# Patient Record
Sex: Female | Born: 1975 | Race: White | Hispanic: No | Marital: Single | Smoking: Current every day smoker
Health system: Southern US, Community
[De-identification: ages and names within clinical notes are randomized; demographics above are authoritative.]

## PROBLEM LIST (undated history)

## (undated) DIAGNOSIS — G5601 Carpal tunnel syndrome, right upper limb: Secondary | ICD-10-CM

## (undated) DIAGNOSIS — M1990 Unspecified osteoarthritis, unspecified site: Secondary | ICD-10-CM

## (undated) DIAGNOSIS — L659 Nonscarring hair loss, unspecified: Secondary | ICD-10-CM

## (undated) DIAGNOSIS — Z98811 Dental restoration status: Secondary | ICD-10-CM

## (undated) DIAGNOSIS — Z972 Presence of dental prosthetic device (complete) (partial): Secondary | ICD-10-CM

## (undated) DIAGNOSIS — J302 Other seasonal allergic rhinitis: Secondary | ICD-10-CM

---

## 1999-07-26 ENCOUNTER — Emergency Department (HOSPITAL_COMMUNITY): Admission: EM | Admit: 1999-07-26 | Discharge: 1999-07-26 | Payer: Self-pay | Admitting: Emergency Medicine

## 1999-07-26 ENCOUNTER — Encounter: Payer: Self-pay | Admitting: Emergency Medicine

## 2013-03-18 ENCOUNTER — Emergency Department (INDEPENDENT_AMBULATORY_CARE_PROVIDER_SITE_OTHER): Payer: 59

## 2013-03-18 ENCOUNTER — Emergency Department (INDEPENDENT_AMBULATORY_CARE_PROVIDER_SITE_OTHER)
Admission: EM | Admit: 2013-03-18 | Discharge: 2013-03-18 | Disposition: A | Discharge status: Home / Self Care | Payer: 59 | Source: Home / Self Care | Attending: Emergency Medicine | Admitting: Emergency Medicine

## 2013-03-18 ENCOUNTER — Encounter: Payer: Self-pay | Admitting: Emergency Medicine

## 2013-03-18 DIAGNOSIS — M25469 Effusion, unspecified knee: Secondary | ICD-10-CM

## 2013-03-18 DIAGNOSIS — M7650 Patellar tendinitis, unspecified knee: Secondary | ICD-10-CM

## 2013-03-18 DIAGNOSIS — IMO0002 Reserved for concepts with insufficient information to code with codable children: Secondary | ICD-10-CM

## 2013-03-18 DIAGNOSIS — S8391XA Sprain of unspecified site of right knee, initial encounter: Secondary | ICD-10-CM

## 2013-03-18 DIAGNOSIS — M25569 Pain in unspecified knee: Secondary | ICD-10-CM

## 2013-03-18 DIAGNOSIS — M7651 Patellar tendinitis, right knee: Secondary | ICD-10-CM

## 2013-03-18 HISTORY — DX: Nonscarring hair loss, unspecified: L65.9

## 2013-03-18 MED ORDER — IBUPROFEN 600 MG PO TABS: 600.0000 mg | ORAL_TABLET | Freq: Three times a day (TID) | ORAL | Status: DC | PRN

## 2013-03-18 NOTE — ED Provider Notes (Signed)
CSN: [id number]     Arrival date & time 03/18/13  1041 History   First MD Initiated Contact with Patient 03/18/13 1051     Chief Complaint  Patient presents with  . Knee Pain   (Consider location/radiation/quality/duration/timing/severity/associated sxs/prior Treatment) Patient is a 37 y.o. female presenting with knee pain. The history is provided by the patient.  Knee Pain Location:  Knee Time since incident:  1 day Injury: yes   Mechanism of injury comment:  At home, was kneeling while cleaning her house, with repetitive motions of kneeling and squatting Knee location:  R knee Pain details:    Quality:  Sharp   Radiates to:  Does not radiate   Severity:  Moderate   Onset quality:  Gradual   Progression:  Worsening Chronicity:  New Prior injury to area:  No Relieved by:  Rest Worsened by:  Bearing weight and flexion (But she is able to walk and weight-bear) Ineffective treatments:  None tried Associated symptoms: decreased ROM, stiffness and swelling   Associated symptoms: no back pain, no fever, no muscle weakness, no neck pain, no numbness and no tingling   Risk factors: no known bone disorder and no recent illness   Risk factors comment:  No history of arthritis or chronic joint pain   Past Medical History  Diagnosis Date  . Alopecia   . Hydradenitis    Past Surgical History  Procedure Laterality Date  . Cyst excision     Family History  Problem Relation Age of Onset  . Cancer Mother     ovarian, colon CA  . Hyperlipidemia Father   . Heart failure Father    History  Substance Use Topics  . Smoking status: Current Every Day Smoker -- 1.00 packs/day    Types: Cigarettes  . Smokeless tobacco: Never Used  . Alcohol Use: No   OB History   Grav Para Term Preterm Abortions TAB SAB Ect Mult Living                 Review of Systems  Constitutional: Negative for fever.  Respiratory: Negative.   Cardiovascular: Negative.   Genitourinary: Negative.    Musculoskeletal: Positive for stiffness. Negative for back pain and neck pain.  Neurological: Negative.   Psychiatric/Behavioral: Negative.   All other systems reviewed and are negative.    Allergies  Penicillins and Tuberculin tests  Home Medications   Current Outpatient Rx  Name  Route  Sig  Dispense  Refill  . cetirizine (ZYRTEC) 5 MG tablet   Oral   Take 5 mg by mouth daily.         Marland Kitchen ibuprofen (ADVIL,MOTRIN) 600 MG tablet   Oral   Take 1 tablet (600 mg total) by mouth every 8 (eight) hours as needed for pain.   30 tablet   0    BP 144/84  Pulse 103  Resp 16  Ht 5\' 7"  (1.702 m)  Wt 153 lb (69.4 kg)  BMI 23.96 kg/m2  SpO2 98%  LMP 03/18/2013 Physical Exam  Nursing note and vitals reviewed. Constitutional: She is oriented to person, place, and time. She appears well-developed and well-nourished. No distress.  HENT:  Head: Normocephalic and atraumatic.  Eyes: Conjunctivae and EOM are normal. Pupils are equal, round, and reactive to light. No scleral icterus.  Neck: Normal range of motion.  Cardiovascular: Normal rate.   Pulmonary/Chest: Effort normal.  Abdominal: She exhibits no distension.  Musculoskeletal:       Right knee: She exhibits  decreased range of motion, swelling (Mild, diffuse) and bony tenderness. She exhibits no effusion, no ecchymosis, no deformity, no laceration, no LCL laxity, normal patellar mobility and no MCL laxity. Tenderness (Directly over patella, and infrapatellar) found. Patellar tendon (The patellar tendon functions normally, intact) tenderness noted. No medial joint line and no lateral joint line tenderness noted.  Negative Lochman's test.  Neurovascular distally intact  Neurological: She is alert and oriented to person, place, and time.  Skin: Skin is warm.  Psychiatric: She has a normal mood and affect.    ED Course  Procedures (including critical care time) Labs Review Labs Reviewed - No data to display Imaging Review Dg  Knee Complete 4 Views Right  03/18/2013   CLINICAL DATA:  Swelling and pain above patella.  No trauma.  EXAM: RIGHT KNEE - COMPLETE 4+ VIEW  COMPARISON:  None.  FINDINGS: Mild patellofemoral osteoarthritis. No acute fracture or dislocation. No joint effusion.  IMPRESSION: Degenerative change, without acute osseous finding.   Electronically Signed   By: Jeronimo Greaves M.D.   On: 03/18/2013 12:07    EKG Interpretation     Ventricular Rate:    PR Interval:    QRS Duration:   QT Interval:    QTC Calculation:   R Axis:     Text Interpretation:              MDM   1. Right knee sprain, initial encounter   2. Patellar tendinitis, right    Reviewed x-ray with patient. No acute bony abnormalities. No fracture or dislocation. No joint effusion. There is mild osteoarthritis. Discussed diagnosis and treatment options. Prescription for ibuprofen 600 mg every 8 hours with food when necessary pain. She declined any other pain med. Elastic knee sleeve. Knee sprain instruction sheet given. Questions invited and answered. Followup with orthopedist if not improving in one week, sooner if worse or new symptoms.--Names and phone numbers of orthopedists given. Precautions discussed. Red flags discussed. Questions invited and answered. Patient voiced understanding and agreement.    Lajean Manes, MD 03/18/13 [phone]

## 2013-03-18 NOTE — ED Notes (Signed)
Jennifer Robinson c/o right knee pain x this AM. She did a lot of house work yesterday and cleaning while on her knees. No previous injury.

## 2013-03-29 ENCOUNTER — Ambulatory Visit (INDEPENDENT_AMBULATORY_CARE_PROVIDER_SITE_OTHER): Payer: 59 | Admitting: Sports Medicine

## 2013-03-29 ENCOUNTER — Encounter: Payer: Self-pay | Admitting: Sports Medicine

## 2013-03-29 ENCOUNTER — Other Ambulatory Visit: Payer: Self-pay | Admitting: *Deleted

## 2013-03-29 VITALS — BP 135/86 | HR 92 | Wt 161.0 lb

## 2013-03-29 DIAGNOSIS — G5603 Carpal tunnel syndrome, bilateral upper limbs: Secondary | ICD-10-CM | POA: Insufficient documentation

## 2013-03-29 DIAGNOSIS — M222X1 Patellofemoral disorders, right knee: Secondary | ICD-10-CM | POA: Insufficient documentation

## 2013-03-29 DIAGNOSIS — G5600 Carpal tunnel syndrome, unspecified upper limb: Secondary | ICD-10-CM

## 2013-03-29 DIAGNOSIS — M25569 Pain in unspecified knee: Secondary | ICD-10-CM

## 2013-03-29 MED ORDER — MELOXICAM 15 MG PO TABS: ORAL_TABLET | ORAL | Status: DC

## 2013-03-29 NOTE — Assessment & Plan Note (Signed)
Improved significantly with conservative measures. Mobic. Home exercise. Return as needed for this.

## 2013-03-29 NOTE — Progress Notes (Signed)
   Subjective:    I'm seeing this patient as a consultation for:  Dr. 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 Unfortunately for the past few months she's had pain that is sharp and tingling that radiates from her wrist into her fingers. It's worse after sleeping, is worse when trying to grasp items. Moderate, persistent. No pain in her neck, no pain in the forearm or upper arm.  Right knee pain: Had an injury where she sprained it, the pain is essentially resolved with the exception of some soreness under the kneecap when going up and down stairs. She also gets a slight grinding sensation.  Past medical history, Surgical history, Family history not pertinant except as noted below, Social history, Allergies, and medications have been entered into the medical record, reviewed, and no changes needed.   Review of Systems: No headache, visual changes, nausea, vomiting, diarrhea, constipation, dizziness, abdominal pain, skin rash, fevers, chills, night sweats, weight loss, swollen lymph nodes, body aches, joint swelling, muscle aches, chest pain, shortness of breath, mood changes, visual or auditory hallucinations.   Objective:   General: Well Developed, well nourished, and in no acute distress.  Neuro/Psych: Alert and oriented x3, extra-ocular muscles intact, able to move all 4 extremities, sensation grossly intact. Skin: Warm and dry, no rashes noted.  Respiratory: Not using accessory muscles, speaking in full sentences, trachea midline.  Cardiovascular: Pulses palpable, no extremity edema. Abdomen: Does not appear distended. Bilateral Wrist: Inspection normal with no visible erythema or swelling. ROM smooth and normal with good flexion and extension and ulnar/radial deviation that is symmetrical with opposite wrist. Palpation is normal over metacarpals, navicular, lunate, and TFCC;  tendons without tenderness/ swelling No snuffbox tenderness. No tenderness over Canal of [place]. Strength 5/5 in all directions without pain. Negative Finkelstein, positive Tinel's test, negative Phalen's test. Negative Watson's test. Right Knee: Normal to inspection with no erythema or effusion or obvious bony abnormalities. Palpation normal with no warmth, joint line tenderness, patellar tenderness, or condyle tenderness. ROM full in flexion and extension and lower leg rotation. Ligaments with solid consistent endpoints including ACL, PCL, LCL, MCL. Negative Mcmurray's, Apley's, and Thessalonian tests. Patellar glide with crepitus and mild pain on compression. Patellar and quadriceps tendons unremarkable. Hamstring and quadriceps strength is normal.   X-rays show patellofemoral arthritis.  Impression and Recommendations:   This case required medical decision making of moderate complexity.

## 2013-03-29 NOTE — Assessment & Plan Note (Signed)
Bilateral nighttime splinting. Home exercises. Mobic. Return in 4 weeks, we can consider an injection if no better.

## 2013-04-12 ENCOUNTER — Encounter: Payer: Self-pay | Admitting: Emergency Medicine

## 2013-04-12 ENCOUNTER — Emergency Department
Admission: EM | Admit: 2013-04-12 | Discharge: 2013-04-12 | Disposition: A | Discharge status: Home / Self Care | Payer: 59 | Source: Home / Self Care | Attending: Emergency Medicine | Admitting: Emergency Medicine

## 2013-04-12 DIAGNOSIS — H60399 Other infective otitis externa, unspecified ear: Secondary | ICD-10-CM

## 2013-04-12 DIAGNOSIS — H6093 Unspecified otitis externa, bilateral: Secondary | ICD-10-CM

## 2013-04-12 NOTE — ED Notes (Signed)
Jennifer Robinson reports her employer wants her to wear earplugs but she c/o ear plugs giving her ear infections and a hx of chronic ear infections. She is here for a note allowing her to wear ear muffs instead of ear plug while @ work .

## 2013-04-14 NOTE — ED Provider Notes (Signed)
CSN: [id number]     Arrival date & time 04/12/13  1456 History   First MD Initiated Contact with Patient 04/12/13 1456     Chief Complaint  Patient presents with  . Ear Problem   (Consider location/radiation/quality/duration/timing/severity/associated sxs/prior Treatment) HPI Jennifer Robinson reports her employer wants her to wear earplugs but she c/o ear plugs giving her ear infections and a hx of chronic ear infections. She is here for a note allowing her to wear ear muffs instead of ear plug while @ work .  Currently, complains of mild dull bilateral external ear irritation from wearing earplugs the past week at work. No current discharge or fever or hearing problems. No other ENT symptoms, except minimal sinus allergy symptoms, controlled on Zyrtec 5 mg daily Past Medical History  Diagnosis Date  . Alopecia   . Hydradenitis    Past Surgical History  Procedure Laterality Date  . Cyst excision     Family History  Problem Relation Age of Onset  . Cancer Mother     ovarian, colon CA  . Hyperlipidemia Father   . Heart failure Father    History  Substance Use Topics  . Smoking status: Current Every Day Smoker -- 1.00 packs/day    Types: Cigarettes  . Smokeless tobacco: Never Used  . Alcohol Use: No   OB History   Grav Para Term Preterm Abortions TAB SAB Ect Mult Living                 Review of Systems  All other systems reviewed and are negative.    Allergies  Penicillins and Tuberculin tests  Home Medications   Current Outpatient Rx  Name  Route  Sig  Dispense  Refill  . cetirizine (ZYRTEC) 5 MG tablet   Oral   Take 5 mg by mouth daily.         . meloxicam (MOBIC) 15 MG tablet      One tab PO qAM with breakfast for 2 weeks, then daily prn pain.   30 tablet   3    BP 138/84  Pulse 74  Temp(Src) 98.2 F (36.8 C) (Oral)  Resp 14  SpO2 100%  LMP 03/10/2013 Physical Exam  Nursing note and vitals reviewed. Constitutional: She is oriented to person, place,  and time. She appears well-developed and well-nourished. No distress.  HENT:  Head: Normocephalic and atraumatic.  Right Ear: Tympanic membrane normal.  Left Ear: Tympanic membrane normal.  Nose: Nose normal.  Mouth/Throat: Oropharynx is clear and moist. No oral lesions.  + minimal Tenderness over RIGHT external ear. No Swelling RIGHT external ear canal. No  Discharge/debris in RIGHT external ear canal.  + minimal Tenderness over LEFT external ear. No swelling LEFT external ear canal. No Discharge/debris in LEFT external ear canal.   Eyes: Conjunctivae are normal.  Neck: Neck supple.  Lymphadenopathy:    She has no cervical adenopathy.  Neurological: She is alert and oriented to person, place, and time.  Skin: Skin is warm and dry.    ED Course  Procedures (including critical care time) Labs Review Labs Reviewed - No data to display Imaging Review No results found.  EKG Interpretation    Date/Time:    Ventricular Rate:    PR Interval:    QRS Duration:   QT Interval:    QTC Calculation:   R Axis:     Text Interpretation:              MDM   1.  Otitis externa of both ears    Likely, this is a mechanical irritation/otitis externa of both ears, secondary to frequently wearing earplugs. We discussed this at length.-- At her request, I wrote a note for work digitalizing to avoid earplugs, and instead, to wear over the ear earmuffs-type for your protection while at work. At this time, no sign of infection, so no antibiotic prescribed. Other advice given. Explained to her that if she had any other your symptoms, to follow-up with her ENT. She voiced understanding and agreement.    Lajean Manes, MD 04/14/13 1323

## 2013-04-26 ENCOUNTER — Ambulatory Visit (INDEPENDENT_AMBULATORY_CARE_PROVIDER_SITE_OTHER): Payer: 59 | Admitting: Sports Medicine

## 2013-04-26 ENCOUNTER — Encounter: Payer: Self-pay | Admitting: Sports Medicine

## 2013-04-26 VITALS — BP 135/84 | HR 91 | Wt 160.0 lb

## 2013-04-26 DIAGNOSIS — G5603 Carpal tunnel syndrome, bilateral upper limbs: Secondary | ICD-10-CM

## 2013-04-26 DIAGNOSIS — M222X1 Patellofemoral disorders, right knee: Secondary | ICD-10-CM

## 2013-04-26 DIAGNOSIS — G5600 Carpal tunnel syndrome, unspecified upper limb: Secondary | ICD-10-CM

## 2013-04-26 DIAGNOSIS — M25569 Pain in unspecified knee: Secondary | ICD-10-CM

## 2013-04-26 NOTE — Assessment & Plan Note (Signed)
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 Return in one month, we can consider a bilateral median nerve hydrodissection if no better.

## 2013-04-26 NOTE — Assessment & Plan Note (Signed)
Resolved with conservative measures. 

## 2013-04-26 NOTE — Progress Notes (Signed)
  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  Bilateral carpal tunnel syndrome: Improved significantly with nighttime splinting, home exercises, anti-inflammatories, desires to continue night splinting before consideration of injection.  Past medical history, Surgical history, Family history not pertinant except as noted below, Social history, Allergies, and medications have been entered into the medical record, reviewed, and no changes needed.   Review of Systems: No fevers, chills, night sweats, weight loss, chest pain, or shortness of breath.   Objective:    General: Well Developed, well nourished, and in no acute distress.  Neuro: Alert and oriented x3, extra-ocular muscles intact, sensation grossly intact.  HEENT: Normocephalic, atraumatic, pupils equal round reactive to light, neck supple, no masses, no lymphadenopathy, thyroid nonpalpable.  Skin: Warm and dry, no rashes. Cardiac: Regular rate and rhythm, no murmurs rubs or gallops, no lower extremity edema.  Respiratory: Clear to auscultation bilaterally. Not using accessory muscles, speaking in full sentences.  Impression and Recommendations:

## 2013-06-07 ENCOUNTER — Ambulatory Visit (INDEPENDENT_AMBULATORY_CARE_PROVIDER_SITE_OTHER): Payer: 59 | Admitting: Sports Medicine

## 2013-06-07 ENCOUNTER — Encounter: Payer: Self-pay | Admitting: Sports Medicine

## 2013-06-07 VITALS — BP 130/81 | HR 96 | Wt 156.0 lb

## 2013-06-07 DIAGNOSIS — G5603 Carpal tunnel syndrome, bilateral upper limbs: Secondary | ICD-10-CM

## 2013-06-07 DIAGNOSIS — G5600 Carpal tunnel syndrome, unspecified upper limb: Secondary | ICD-10-CM

## 2013-06-07 NOTE — Assessment & Plan Note (Signed)
Bilateral median nerve hydrodissection as above. Continue nighttime splinting. Return in one month.

## 2013-06-07 NOTE — Progress Notes (Signed)
  Subjective:    CC: Followup  HPI: This pleasant 38 year old female returns after several months of conservative treatment of bilateral carpal tunnel syndrome. We have been through night splinting, NSAIDs, home stretches and rehabilitation exercises. Nothing is helping. She returns today with symptoms moderate, persistent, and desiring interventional treatment.  Past medical history, Surgical history, Family history not pertinant except as noted below, Social history, Allergies, and medications have been entered into the medical record, reviewed, and no changes needed.   Review of Systems: No fevers, chills, night sweats, weight loss, chest pain, or shortness of breath.   Objective:    General: Well Developed, well nourished, and in no acute distress.  Neuro: Alert and oriented x3, extra-ocular muscles intact, sensation grossly intact.  HEENT: Normocephalic, atraumatic, pupils equal round reactive to light, neck supple, no masses, no lymphadenopathy, thyroid nonpalpable.  Skin: Warm and dry, no rashes. Cardiac: Regular rate and rhythm, no murmurs rubs or gallops, no lower extremity edema.  Respiratory: Clear to auscultation bilaterally. Not using accessory muscles, speaking in full sentences.  Procedure: Real-time Ultrasound Guided hydrodissection/Injection of left carpal tunnel/median nerve Device: GE Logiq E  Verbal informed consent obtained.  Time-out conducted.  Noted no overlying erythema, induration, or other signs of local infection.  Skin prepped in a sterile fashion.  Local anesthesia: Topical Ethyl chloride.  With sterile technique and under real time ultrasound guidance:  A total of 1 cc Kenalog 40, 4 cc lidocaine injected superficial tube and deep to the median nerve dissecting it from surrounding structures. Completed without difficulty  Pain immediately resolved suggesting accurate placement of the medication.  Advised to call if fevers/chills, erythema, induration,  drainage, or persistent bleeding.  Images permanently stored and available for review in the ultrasound unit.  Impression: Technically successful ultrasound guided injection.  Procedure: Real-time Ultrasound Guided hydrodissection/Injection of right carpal tunnel/median nerve Device: GE Logiq E  Verbal informed consent obtained.  Time-out conducted.  Noted no overlying erythema, induration, or other signs of local infection.  Skin prepped in a sterile fashion.  Local anesthesia: Topical Ethyl chloride.  With sterile technique and under real time ultrasound guidance:  A total of 1 cc Kenalog 40, 4 cc lidocaine injected superficial tube and deep to the median nerve dissecting it from surrounding structures. Completed without difficulty  Pain immediately resolved suggesting accurate placement of the medication.  Advised to call if fevers/chills, erythema, induration, drainage, or persistent bleeding.  Images permanently stored and available for review in the ultrasound unit.  Impression: Technically successful ultrasound guided injection.  Impression and Recommendations:

## 2013-07-05 ENCOUNTER — Ambulatory Visit (INDEPENDENT_AMBULATORY_CARE_PROVIDER_SITE_OTHER): Payer: 59 | Admitting: Sports Medicine

## 2013-07-05 ENCOUNTER — Encounter: Payer: Self-pay | Admitting: Sports Medicine

## 2013-07-05 VITALS — BP 130/80 | HR 97 | Ht 67.0 in | Wt 150.0 lb

## 2013-07-05 DIAGNOSIS — G5600 Carpal tunnel syndrome, unspecified upper limb: Secondary | ICD-10-CM

## 2013-07-05 DIAGNOSIS — L732 Hidradenitis suppurativa: Secondary | ICD-10-CM

## 2013-07-05 DIAGNOSIS — M25569 Pain in unspecified knee: Secondary | ICD-10-CM

## 2013-07-05 DIAGNOSIS — M222X1 Patellofemoral disorders, right knee: Secondary | ICD-10-CM

## 2013-07-05 DIAGNOSIS — G5603 Carpal tunnel syndrome, bilateral upper limbs: Secondary | ICD-10-CM

## 2013-07-05 MED ORDER — MINOCYCLINE HCL 100 MG PO CAPS: 100.0000 mg | ORAL_CAPSULE | Freq: Two times a day (BID) | ORAL | Status: DC

## 2013-07-05 MED ORDER — CLINDAMYCIN PHOSPHATE 1 % EX GEL: Freq: Two times a day (BID) | CUTANEOUS | Status: DC

## 2013-07-05 MED ORDER — MELOXICAM 15 MG PO TABS: ORAL_TABLET | ORAL | Status: DC

## 2013-07-05 NOTE — Progress Notes (Signed)
  Subjective:    CC: Followup  HPI: Bilateral carpal tunnel syndrome: Status post bilateral median nerve hydrodissection, she reports that she is approximately 80+ percent at her. Happy with the results so far.  Hidradenitis suppurativa: Typically gets flares over the buttock, her dermatologist is typically prescribed minocycline 100 mg twice a day for 3 months, and topical clindamycin. She asks if I can do this as well. Symptoms are moderate, persistent.  Past medical history, Surgical history, Family history not pertinant except as noted below, Social history, Allergies, and medications have been entered into the medical record, reviewed, and no changes needed.   Review of Systems: No fevers, chills, night sweats, weight loss, chest pain, or shortness of breath.   Objective:    General: Well Developed, well nourished, and in no acute distress.  Neuro: Alert and oriented x3, extra-ocular muscles intact, sensation grossly intact.  HEENT: Normocephalic, atraumatic, pupils equal round reactive to light, neck supple, no masses, no lymphadenopathy, thyroid nonpalpable.  Skin: Warm and dry, no rashes. There is an abscess appearing structure that is nonfluctuant over the right buttock, there is warmth, erythema, but no induration or drainage. Cardiac: Regular rate and rhythm, no murmurs rubs or gallops, no lower extremity edema.  Respiratory: Clear to auscultation bilaterally. Not using accessory muscles, speaking in full sentences.  Impression and Recommendations:

## 2013-07-05 NOTE — Assessment & Plan Note (Signed)
80% improvement bilaterally. Continue nighttime splinting. Return as needed for this.

## 2013-07-05 NOTE — Assessment & Plan Note (Signed)
Minocycline 100 mg twice a day for 3 months. Topical clindamycin. Return as needed.

## 2013-10-04 ENCOUNTER — Ambulatory Visit: Payer: 59 | Admitting: Sports Medicine

## 2014-01-13 ENCOUNTER — Ambulatory Visit (INDEPENDENT_AMBULATORY_CARE_PROVIDER_SITE_OTHER): Payer: 59 | Admitting: Sports Medicine

## 2014-01-13 ENCOUNTER — Encounter: Payer: Self-pay | Admitting: Sports Medicine

## 2014-01-13 VITALS — BP 145/87 | HR 103 | Ht 67.0 in | Wt 165.0 lb

## 2014-01-13 DIAGNOSIS — G5603 Carpal tunnel syndrome, bilateral upper limbs: Secondary | ICD-10-CM

## 2014-01-13 DIAGNOSIS — G5600 Carpal tunnel syndrome, unspecified upper limb: Secondary | ICD-10-CM

## 2014-01-13 NOTE — Assessment & Plan Note (Signed)
Persistent symptoms with a o two-month response to ultrasound-guided bilateral median nerve hydrodissection, has already failed nighttime splinting and therapy. At this point we are going to refer her to go for orthopedics for consideration of carpal tunnel release.

## 2014-01-13 NOTE — Progress Notes (Signed)
  Subjective:    CC: Followup  HPI: Bilateral carpal tunnel syndrome: Good response to a previous bilateral median nerve hydrodissection, unfortunately she had a recurrence of pain approximately 2 months after the injection. She is amenable to considering surgical intervention.  Past medical history, Surgical history, Family history not pertinant except as noted below, Social history, Allergies, and medications have been entered into the medical record, reviewed, and no changes needed.   Review of Systems: No fevers, chills, night sweats, weight loss, chest pain, or shortness of breath.   Objective:    General: Well Developed, well nourished, and in no acute distress.  Neuro: Alert and oriented x3, extra-ocular muscles intact, sensation grossly intact.  HEENT: Normocephalic, atraumatic, pupils equal round reactive to light, neck supple, no masses, no lymphadenopathy, thyroid nonpalpable.  Skin: Warm and dry, no rashes. Cardiac: Regular rate and rhythm, no murmurs rubs or gallops, no lower extremity edema.  Respiratory: Clear to auscultation bilaterally. Not using accessory muscles, speaking in full sentences.  Impression and Recommendations:

## 2014-03-03 ENCOUNTER — Other Ambulatory Visit: Payer: Self-pay | Admitting: Orthopedic Surgery

## 2014-03-04 ENCOUNTER — Encounter (HOSPITAL_BASED_OUTPATIENT_CLINIC_OR_DEPARTMENT_OTHER): Payer: Self-pay | Admitting: *Deleted

## 2014-03-06 NOTE — H&P (Signed)
Jennifer Robinson is an 38 y.o. female.   CC / Reason for Visit: Bilateral hand pain with numbness and tingling HPI: This patient returns for reevaluation having undergone an interval electrodiagnostic studies with Dr. Regino SchultzeWang revealing mild bilateral carpal tunnel syndrome with Martin-Gruber anastomosis.  She reports she is symptomatically the same and wishes to proceed surgically.  She would like to proceed first on the left side.    Presenting history follows: This patient is a 38 year old female who presents for evaluation of numbness and tingling involving both hands.  With careful questioning, she has had symptoms for a long time, recently worse.  She is not diabetic, and had tried meloxicam but stopped as it seems not to be helping.  She has had wrist splints to wear at night time and has had ultrasound guided injections performed already which were transiently helpful.  Past Medical History  Diagnosis Date  . Alopecia   . Arthritis     knee  . Carpal tunnel syndrome of left wrist 02/2014  . Hidradenitis 03/04/2014    buttocks  . Dental crown present   . Wears partial dentures   . Seasonal allergies     Past Surgical History  Procedure Laterality Date  . No past surgeries      Family History  Problem Relation Age of Onset  . Cancer Mother     ovarian, colon CA  . Hyperlipidemia Father   . Heart failure Father    Social History:  reports that she has been smoking Cigarettes.  She has a 22 pack-year smoking history. She has never used smokeless tobacco. She reports that she drinks alcohol. She reports that she does not use illicit drugs.  Allergies:  Allergies  Allergen Reactions  . Latex Rash  . Penicillins Rash  . Ppd [Tuberculin Purified Protein Derivative] Rash    No prescriptions prior to admission    No results found for this or any previous visit (from the past 48 hour(s)). No results found.  Review of Systems  All other systems reviewed and are  negative.   Height 5\' 7"  (1.702 m), weight 70.308 kg (155 lb), last menstrual period 02/18/2014. Physical Exam  Constitutional:  WD, WN, NAD HEENT:  NCAT, EOMI Neuro/Psych:  Alert & oriented to person, place, and time; appropriate mood & affect Lymphatic: No generalized UE edema or lymphadenopathy Extremities / MSK:  Both UE are normal with respect to appearance, ranges of motion, joint stability, muscle strength/tone, sensation, & perfusion except as otherwise noted:  Both hands have good musculature.  Monofilament testing 2.83 across all the digits.  Bilaterally with Phalen's maneuver she has symptoms that radiate into the long and ring fingers, with a Tinel sign over both median nerves just proximal to the wrist crease.  Labs / X-rays: No radiographic studies obtained today.  Assessment:  Confirmed bilateral carpal tunnel syndrome  Plan:  We reviewed the lateral studies and the details of endoscopic carpal tunnel release.  She would like to proceed first on the left side.  We will plan to proceed at a time that fits her schedule best.    The details of the operative procedure were discussed with the patient.  Questions were invited and answered.  In addition to the goal of the procedure, the risks of the procedure to include but not limited to bleeding; infection; damage to the nerves or blood vessels that could result in bleeding, numbness, weakness, chronic pain, and the need for additional procedures; stiffness; the need for  revision surgery; and anesthetic risks, the worst of which is death, were reviewed.  No specific outcome was guaranteed or implied.  Informed consent was obtained.    Javien Tesch A. 03/06/2014, 2:15 PM

## 2014-03-07 ENCOUNTER — Other Ambulatory Visit: Payer: Self-pay

## 2014-03-10 ENCOUNTER — Encounter (HOSPITAL_BASED_OUTPATIENT_CLINIC_OR_DEPARTMENT_OTHER)
Admission: RE | Disposition: A | Discharge status: Home / Self Care | Payer: Self-pay | Source: Ambulatory Visit | Attending: Orthopedic Surgery

## 2014-03-10 ENCOUNTER — Ambulatory Visit (HOSPITAL_BASED_OUTPATIENT_CLINIC_OR_DEPARTMENT_OTHER): Payer: 59 | Admitting: Certified Registered"

## 2014-03-10 ENCOUNTER — Encounter (HOSPITAL_BASED_OUTPATIENT_CLINIC_OR_DEPARTMENT_OTHER): Payer: 59 | Admitting: Certified Registered"

## 2014-03-10 ENCOUNTER — Ambulatory Visit (HOSPITAL_BASED_OUTPATIENT_CLINIC_OR_DEPARTMENT_OTHER)
Admission: RE | Admit: 2014-03-10 | Discharge: 2014-03-10 | Disposition: A | Discharge status: Home / Self Care | Payer: 59 | Source: Ambulatory Visit | Attending: Orthopedic Surgery | Admitting: Orthopedic Surgery

## 2014-03-10 ENCOUNTER — Encounter (HOSPITAL_BASED_OUTPATIENT_CLINIC_OR_DEPARTMENT_OTHER): Payer: Self-pay | Admitting: Certified Registered"

## 2014-03-10 DIAGNOSIS — F17210 Nicotine dependence, cigarettes, uncomplicated: Secondary | ICD-10-CM | POA: Diagnosis not present

## 2014-03-10 DIAGNOSIS — G5602 Carpal tunnel syndrome, left upper limb: Secondary | ICD-10-CM | POA: Diagnosis not present

## 2014-03-10 DIAGNOSIS — G5600 Carpal tunnel syndrome, unspecified upper limb: Secondary | ICD-10-CM | POA: Diagnosis present

## 2014-03-10 HISTORY — DX: Unspecified osteoarthritis, unspecified site: M19.90

## 2014-03-10 HISTORY — DX: Presence of dental prosthetic device (complete) (partial): Z97.2

## 2014-03-10 HISTORY — DX: Other seasonal allergic rhinitis: J30.2

## 2014-03-10 HISTORY — DX: Dental restoration status: Z98.811

## 2014-03-10 HISTORY — PX: CARPAL TUNNEL RELEASE: SHX101

## 2014-03-10 LAB — POCT HEMOGLOBIN-HEMACUE: HEMOGLOBIN: 15.1 g/dL — AB (ref 12.0–15.0)

## 2014-03-10 SURGERY — RELEASE, CARPAL TUNNEL, ENDOSCOPIC
Anesthesia: Monitor Anesthesia Care | Site: Wrist | Laterality: Left

## 2014-03-10 MED ORDER — MIDAZOLAM HCL 5 MG/5ML IJ SOLN
INTRAMUSCULAR | Status: DC | PRN
  Administered 2014-03-10: 2 mg via INTRAVENOUS

## 2014-03-10 MED ORDER — LIDOCAINE HCL (CARDIAC) 20 MG/ML IV SOLN
INTRAVENOUS | Status: DC | PRN
  Administered 2014-03-10: 60 mg via INTRAVENOUS

## 2014-03-10 MED ORDER — LACTATED RINGERS IV SOLN
INTRAVENOUS | Status: DC | PRN
  Administered 2014-03-10: 10:00:00 via INTRAVENOUS

## 2014-03-10 MED ORDER — LIDOCAINE HCL 2 % IJ SOLN
INTRAMUSCULAR | Status: DC | PRN
  Administered 2014-03-10: 3 mL

## 2014-03-10 MED ORDER — OXYCODONE-ACETAMINOPHEN 5-325 MG PO TABS: 1.0000 | ORAL_TABLET | ORAL | Status: DC | PRN

## 2014-03-10 MED ORDER — FENTANYL CITRATE 0.05 MG/ML IJ SOLN
INTRAMUSCULAR | Status: DC | PRN
  Administered 2014-03-10: 100 ug via INTRAVENOUS

## 2014-03-10 MED ORDER — DEXAMETHASONE SODIUM PHOSPHATE 10 MG/ML IJ SOLN
INTRAMUSCULAR | Status: DC | PRN
  Administered 2014-03-10: 4 mg via INTRAVENOUS

## 2014-03-10 MED ORDER — FENTANYL CITRATE 0.05 MG/ML IJ SOLN
INTRAMUSCULAR | Status: AC
  Filled 2014-03-10: qty 2

## 2014-03-10 MED ORDER — PROPOFOL INFUSION 10 MG/ML OPTIME
INTRAVENOUS | Status: DC | PRN
  Administered 2014-03-10: 100 ug/kg/min via INTRAVENOUS

## 2014-03-10 MED ORDER — OXYCODONE HCL 5 MG PO TABS: 5.0000 mg | ORAL_TABLET | Freq: Once | ORAL | Status: DC | PRN

## 2014-03-10 MED ORDER — LIDOCAINE HCL 2 % IJ SOLN
INTRAMUSCULAR | Status: AC
  Filled 2014-03-10: qty 20

## 2014-03-10 MED ORDER — BUPIVACAINE-EPINEPHRINE 0.5% -1:200000 IJ SOLN
INTRAMUSCULAR | Status: DC | PRN
  Administered 2014-03-10: 3 mL

## 2014-03-10 MED ORDER — MIDAZOLAM HCL 2 MG/2ML IJ SOLN
INTRAMUSCULAR | Status: AC
  Filled 2014-03-10: qty 2

## 2014-03-10 MED ORDER — OXYCODONE HCL 5 MG/5ML PO SOLN: 5.0000 mg | Freq: Once | ORAL | Status: DC | PRN

## 2014-03-10 MED ORDER — HYDROMORPHONE HCL 1 MG/ML IJ SOLN: 0.2500 mg | INTRAMUSCULAR | Status: DC | PRN

## 2014-03-10 MED ORDER — CEFAZOLIN SODIUM-DEXTROSE 2-3 GM-% IV SOLR
INTRAVENOUS | Status: AC
Start: 2014-03-10 — End: 2014-03-10
  Filled 2014-03-10: qty 50

## 2014-03-10 SURGICAL SUPPLY — 44 items
APPLICATOR COTTON TIP 6IN STRL (MISCELLANEOUS) ×3 IMPLANT
BLADE HOOK ENDO STRL (BLADE) ×3 IMPLANT
BLADE SURG 15 STRL LF DISP TIS (BLADE) ×1 IMPLANT
BLADE SURG 15 STRL SS (BLADE) ×3
BLADE TRIANGLE EPF/EGR ENDO (BLADE) ×3 IMPLANT
BNDG CMPR 9X4 STRL LF SNTH (GAUZE/BANDAGES/DRESSINGS) ×1
BNDG COHESIVE 4X5 TAN STRL (GAUZE/BANDAGES/DRESSINGS) ×3 IMPLANT
BNDG ESMARK 4X9 LF (GAUZE/BANDAGES/DRESSINGS) ×3 IMPLANT
BNDG GAUZE ELAST 4 BULKY (GAUZE/BANDAGES/DRESSINGS) ×3 IMPLANT
CHLORAPREP W/TINT 26ML (MISCELLANEOUS) ×3 IMPLANT
CORDS BIPOLAR (ELECTRODE) IMPLANT
COVER BACK TABLE 60X90IN (DRAPES) ×3 IMPLANT
COVER MAYO STAND STRL (DRAPES) ×3 IMPLANT
CUFF TOURNIQUET SINGLE 18IN (TOURNIQUET CUFF) ×2 IMPLANT
DRAIN TLS ROUND 10FR (DRAIN) ×3 IMPLANT
DRAPE EXTREMITY TIBURON (DRAPES) ×3 IMPLANT
DRAPE SURG 17X23 STRL (DRAPES) ×3 IMPLANT
DRSG EMULSION OIL 3X3 NADH (GAUZE/BANDAGES/DRESSINGS) ×3 IMPLANT
GAUZE SPONGE 4X4 12PLY STRL (GAUZE/BANDAGES/DRESSINGS) ×3 IMPLANT
GLOVE BIO SURGEON STRL SZ7.5 (GLOVE) ×1 IMPLANT
GLOVE BIOGEL PI IND STRL 7.0 (GLOVE) ×1 IMPLANT
GLOVE BIOGEL PI IND STRL 8 (GLOVE) ×1 IMPLANT
GLOVE BIOGEL PI INDICATOR 7.0 (GLOVE)
GLOVE BIOGEL PI INDICATOR 8 (GLOVE)
GLOVE ECLIPSE 6.5 STRL STRAW (GLOVE) ×1 IMPLANT
GLOVE SURG SS PI 6.0 STRL IVOR (GLOVE) ×4 IMPLANT
GLOVE SURG SS PI 7.5 STRL IVOR (GLOVE) ×2 IMPLANT
GOWN STRL REUS W/ TWL LRG LVL3 (GOWN DISPOSABLE) ×1 IMPLANT
GOWN STRL REUS W/TWL LRG LVL3 (GOWN DISPOSABLE) ×6
GOWN STRL REUS W/TWL XL LVL3 (GOWN DISPOSABLE) ×3 IMPLANT
NDL HYPO 25X1 1.5 SAFETY (NEEDLE) IMPLANT
NEEDLE HYPO 25X1 1.5 SAFETY (NEEDLE) ×3 IMPLANT
NS IRRIG 1000ML POUR BTL (IV SOLUTION) ×3 IMPLANT
PACK BASIN DAY SURGERY FS (CUSTOM PROCEDURE TRAY) ×3 IMPLANT
PADDING CAST ABS 4INX4YD NS (CAST SUPPLIES)
PADDING CAST ABS COTTON 4X4 ST (CAST SUPPLIES) IMPLANT
STOCKINETTE 6  STRL (DRAPES) ×2
STOCKINETTE 6 STRL (DRAPES) ×1 IMPLANT
SUT VICRYL RAPIDE 4-0 (SUTURE) ×3 IMPLANT
SYR BULB 3OZ (MISCELLANEOUS) ×3 IMPLANT
SYRINGE 10CC LL (SYRINGE) IMPLANT
TOWEL OR 17X24 6PK STRL BLUE (TOWEL DISPOSABLE) ×6 IMPLANT
TOWEL OR NON WOVEN STRL DISP B (DISPOSABLE) ×3 IMPLANT
UNDERPAD 30X30 INCONTINENT (UNDERPADS AND DIAPERS) ×3 IMPLANT

## 2014-03-10 NOTE — Transfer of Care (Signed)
Immediate Anesthesia Transfer of Care Note  Patient: [name]  Procedure(s) Performed: Procedure(s): CARPAL TUNNEL RELEASE ENDOSCOPIC LEFT  (Left)  Patient Location: PACU  Anesthesia Type:MAC  Level of Consciousness: awake, alert , oriented and patient cooperative  Airway & Oxygen Therapy: Patient Spontanous Breathing and Patient connected to face mask oxygen  Post-op Assessment: Report given to PACU RN and Post -op Vital signs reviewed and stable  Post vital signs: Reviewed and stable  Complications: No apparent anesthesia complications

## 2014-03-10 NOTE — Anesthesia Procedure Notes (Signed)
Procedure Name: MAC Date/Time: 03/10/2014 9:45 AM Performed by: Cristoval Teall Pre-anesthesia Checklist: Patient identified, Timeout performed, Emergency Drugs available, Suction available and Patient being monitored Patient Re-evaluated:Patient Re-evaluated prior to inductionOxygen Delivery Method: Simple face mask

## 2014-03-10 NOTE — Anesthesia Preprocedure Evaluation (Addendum)
Anesthesia Evaluation  Patient identified by MRN, date of birth, ID band Patient awake    Reviewed: Allergy & Precautions, H&P , NPO status , Patient's Chart, lab work & pertinent test results  Airway Mallampati: II TM Distance: >3 FB Neck ROM: Full    Dental no notable dental hx. (+) Partial Upper, Dental Advisory Given   Pulmonary Current Smoker,  breath sounds clear to auscultation  Pulmonary exam normal       Cardiovascular negative cardio ROS  Rhythm:Regular Rate:Normal     Neuro/Psych negative neurological ROS  negative psych ROS   GI/Hepatic negative GI ROS, Neg liver ROS,   Endo/Other  negative endocrine ROS  Renal/GU negative Renal ROS  negative genitourinary   Musculoskeletal   Abdominal   Peds  Hematology negative hematology ROS (+)   Anesthesia Other Findings   Reproductive/Obstetrics negative OB ROS                          Anesthesia Physical Anesthesia Plan  ASA: II  Anesthesia Plan: MAC   Post-op Pain Management:    Induction: Intravenous  Airway Management Planned: Simple Face Mask  Additional Equipment:   Intra-op Plan:   Post-operative Plan:   Informed Consent: I have reviewed the patients History and Physical, chart, labs and discussed the procedure including the risks, benefits and alternatives for the proposed anesthesia with the patient or authorized representative who has indicated his/her understanding and acceptance.   Dental advisory given  Plan Discussed with: CRNA  Anesthesia Plan Comments:         Anesthesia Quick Evaluation

## 2014-03-10 NOTE — Discharge Instructions (Signed)
Discharge Instructions ° ° °You have a light dressing on your hand.  °You may begin gentle motion of your fingers and hand immediately, but you should not do any heavy lifting or gripping.  Elevate your hand to reduce pain & swelling of the digits.  Ice over the operative site may be helpful to reduce pain & swelling.  DO NOT USE HEAT. °Pain medicine has been prescribed for you.  °Use your medicine as needed over the first 48 hours, and then you can begin to taper your use. You may use Tylenol in place of your prescribed pain medication, but not IN ADDITION to it. °Leave the dressing in place until the third day after your surgery and then remove it, leaving it open to air.  °After the bandage has been removed you may shower, but do not soak the incision.  °You may drive a car when you are off of prescription pain medications and can safely control your vehicle with both hands. °We will address whether therapy will be required or not when you return to the office. °You may have already made your follow-up appointment when we completed your preop visit.  If not, please call our office today or the next business day to make your return appointment for 10-15 days after surgery. ° ° °Please call [phone] during normal business hours or [phone] after hours for any problems. Including the following: ° °- excessive redness of the incisions °- drainage for more than 4 days °- fever of more than 101.5 F ° °*Please note that pain medications will not be refilled after hours or on weekends. ° ° °TLS Drain Instructions °You have a drain tube in place to help limit the amount of blood that collects under your skin in the early post-operative period.  The amount it drains will vary from person-to-person and is dependent upon many factors.  You will have 1-2 extra drain tubes sent with you from the facility.  You should change the tube according to these instructions below when the tube is about half full. ° °BE SURE TO  SLIDE THE CLAMP TO THE CLOSED POSITION BEFORE CHANGING THE TUBE.   ° °RE-OPEN THE CLAMP ONCE THE GLASS EVACUATION TUBE HAS BEEN CHANGED. ° °24 hours after surgery the amount of drainage will likely be negligible and it will be time to remove the tube and discard it.  Simply remove the tape that secures the flexible plastic drainage tube to the bandage and briskly pull the tube straight out.  You will likely feel a little discomfort during this process, but the tube should slide out as it is not sewn or otherwise secured directly to your body.  It is merely held in place by the bandage itself.  °                           ° °If you are not clear about when your first post-op appointment is scheduled, please call the office on the next business day to inquire about it.  Please also call the office if you have any other questions ([phone]).   ° °Post Anesthesia Home Care Instructions ° °Activity: °Get plenty of rest for the remainder of the day. A responsible adult should stay with you for 24 hours following the procedure.  °For the next 24 hours, DO NOT: °-Drive a car °-Operate machinery °-Drink alcoholic beverages °-Take any medication unless instructed by your physician °-Make any legal decisions or   sign important papers. ° °Meals: °Start with liquid foods such as gelatin or soup. Progress to regular foods as tolerated. Avoid greasy, spicy, heavy foods. If nausea and/or vomiting occur, drink only clear liquids until the nausea and/or vomiting subsides. Call your physician if vomiting continues. ° °Special Instructions/Symptoms: °Your throat may feel dry or sore from the anesthesia or the breathing tube placed in your throat during surgery. If this causes discomfort, gargle with warm salt water. The discomfort should disappear within 24 hours. ° °

## 2014-03-10 NOTE — Interval H&P Note (Signed)
History and Physical Interval Note:  03/10/2014 9:28 AM  Jennifer Robinson  has presented today for surgery, with the diagnosis of Carpal Tunnel Syndrome  The various methods of treatment have been discussed with the patient and family. After consideration of risks, benefits and other options for treatment, the patient has consented to  Procedure(s): CARPAL TUNNEL RELEASE ENDOSCOPIC LEFT  (Left) as a surgical intervention .  The patient's history has been reviewed, patient examined, no change in status, stable for surgery.  I have reviewed the patient's chart and labs.  Questions were answered to the patient's satisfaction.     Juergen Hardenbrook A.

## 2014-03-10 NOTE — Op Note (Signed)
03/10/2014  9:26 AM  PATIENT:  Jennifer Robinson  38 y.o. female  PRE-OPERATIVE DIAGNOSIS:  Carpal Tunnel Syndrome  POST-OPERATIVE DIAGNOSIS:  Same  PROCEDURE:  Procedure(s): CARPAL TUNNEL RELEASE ENDOSCOPIC LEFT   SURGEON:  Surgeon(s): Jodi Marbleavid A Jevon Littlepage, MD  PHYSICIAN ASSISTANT: Danielle RankinKirsten Schrader, OPA-C  ANESTHESIA:  Local / MAC  SPECIMENS:  None  DRAINS:   None  PREOPERATIVE INDICATIONS:  Jennifer Robinson is a  38 y.o. female with a diagnosis of Carpal Tunnel Syndrome who failed conservative measures and elected for surgical management.    The risks benefits and alternatives were discussed with the patient preoperatively including but not limited to the risks of infection, bleeding, nerve injury, cardiopulmonary complications, the need for revision surgery, among others, and the patient verbalized understanding and consented to proceed.  OPERATIVE IMPLANTS: None  OPERATIVE FINDINGS: Tight carpal tunnel, acceptably decompressed following release  OPERATIVE PROCEDURE:  After receiving prophylactic antibiotics, the patient was escorted to the operative theatre and placed in a supine position.   A surgical "time-out" was performed during which the planned procedure, proposed operative site, and the correct patient identity were compared to the operative consent and agreement confirmed by the circulating nurse according to current facility policy.  Following application of a tourniquet to the operative extremity, the planned incisions were marked and anesthetized with a mixture of lidocaine & bupivicaine containing epinephrine.  The exposed skin was prepped with Chloraprep and draped in the usual sterile fashion.  The limb was exsanguinated with an Esmarch bandage and the tourniquet inflated to approximately 100mmHg higher than systolic BP.   The incisions were made sharply. Subcutaneous tissues were dissected with blunt and spreading dissection. At the proximal incision, the deep forearm  fascia was split in line with the skin incision the distal edge grasped with a hemostat. At the mid palmar incision, the palmar fascia was split in line with the skin incision, revealing the underlying superficial palmar arch which was visualized with loupe assisted magnification. The synovial reflector was then introduced into the proximal incision and passed through the carpal canal, uses to reflect synovium from the deep surface of the transverse carpal ligament. It was removed and replaced with the slotted cannula and blunt obturator, passing from proximal to distal and exiting the distal wound superficial to the superficial palmar arch. The obturator was removed and the camera inserted. Visualization of the ligament was acceptable. The triangle shape blade was then inserted distally, advanced to the midportion of the ligament, and there used to create a perforation in the ligament. This instrument was removed and the hooked nstrument was inserted. It was placed into the perforation in the ligament and withdrawn distally, completing transection of the distal half of the ligament. The camera was then removed and placed into the distal end of the cannula. The hooked instrument was placed into the proximal end and advanced facility to be placed into the apex of the V. which had been formed to the distal hemi-transection of the ligament. It was withdrawn proximally, completing transection of the ligament. The adequacy of the release was judged with the scope and the instruments used as a probe. All of the endoscopic instruments are removed and the adequacy of release was again judged from the proximal incision perspective with direct loupe assisted visualization. In addition, the proximal forearm fascia was split for 2 inches proximal to the proximal incision under direct visualization using a sliding scissor technique. The tourniquet was released, the wound copiously irrigated. A TLS drain was  placed to lie  alongside the nerve exiting its own skin hole distally made with some sharp trocar. The skin was then closed with 4-0 Vicryl Rapide interrupted sutures. A light dressing was applied and the balance of 10 mL of the initial anesthetic mixture was placed down the drain, and the drain was clamped to be unclamped in the recovery room.  DISPOSITION:  The patient will be discharged home today, returning in 10-15 days for re-assessment.

## 2014-03-10 NOTE — Anesthesia Postprocedure Evaluation (Signed)
  Anesthesia Post-op Note  Patient: [name]  Procedure(s) Performed: Procedure(s): CARPAL TUNNEL RELEASE ENDOSCOPIC LEFT  (Left)  Patient Location: PACU  Anesthesia Type: MAC  Level of Consciousness: awake and alert   Airway and Oxygen Therapy: Patient Spontanous Breathing  Post-op Pain: none  Post-op Assessment: Post-op Vital signs reviewed, Patient's Cardiovascular Status Stable and Respiratory Function Stable  Post-op Vital Signs: Reviewed  Filed Vitals:   03/10/14 1045  BP: 120/79  Pulse: 79  Temp:   Resp: 17    Complications: No apparent anesthesia complications

## 2014-03-11 ENCOUNTER — Encounter (HOSPITAL_BASED_OUTPATIENT_CLINIC_OR_DEPARTMENT_OTHER): Payer: Self-pay | Admitting: Orthopedic Surgery

## 2014-03-23 DIAGNOSIS — G5601 Carpal tunnel syndrome, right upper limb: Secondary | ICD-10-CM

## 2014-03-23 HISTORY — DX: Carpal tunnel syndrome, right upper limb: G56.01

## 2014-03-27 ENCOUNTER — Other Ambulatory Visit: Payer: Self-pay | Admitting: Orthopedic Surgery

## 2014-03-27 ENCOUNTER — Encounter (HOSPITAL_BASED_OUTPATIENT_CLINIC_OR_DEPARTMENT_OTHER): Payer: Self-pay | Admitting: *Deleted

## 2014-03-29 NOTE — H&P (Signed)
Jennifer Robinson is an 38 y.o. female.   CC / Reason for Visit: Bilateral hand pain with numbness and tingling HPI: This patient returns for reevaluation having undergone an interval electrodiagnostic studies with Dr. Regino SchultzeWang revealing mild bilateral carpal tunnel syndrome with Martin-Gruber anastomosis.  She has already undergone a L ECTR and wishes to proceed on the right.  Presenting history follows: This patient is a 38 year old female who presents for evaluation of numbness and tingling involving both hands.  With careful questioning, she has had symptoms for a long time, recently worse.  She is not diabetic, and had tried meloxicam but stopped as it seems not to be helping.  She has had wrist splints to wear at night time and has had ultrasound guided injections performed already which were transiently helpful.  Past Medical History  Diagnosis Date  . Alopecia   . Dental crown present   . Wears partial dentures     upper  . Seasonal allergies   . Arthritis     both knees  . Carpal tunnel syndrome of right wrist 03/2014    Past Surgical History  Procedure Laterality Date  . Carpal tunnel release Left 03/10/2014    Procedure: CARPAL TUNNEL RELEASE ENDOSCOPIC LEFT ;  Surgeon: Jodi Marbleavid A Tyeisha Dinan, MD;  Location: [facility];  Service: Orthopedics;  Laterality: Left;    Family History  Problem Relation Age of Onset  . Cancer Mother     ovarian, colon CA  . Hyperlipidemia Father   . Heart failure Father    Social History:  reports that she has been smoking Cigarettes.  She has a 22 pack-year smoking history. She has never used smokeless tobacco. She reports that she drinks alcohol. She reports that she does not use illicit drugs.  Allergies:  Allergies  Allergen Reactions  . Latex Rash  . Penicillins Rash  . Ppd [Tuberculin Purified Protein Derivative] Rash    No prescriptions prior to admission    No results found for this or any previous visit (from the past 48  hour(s)). No results found.  Review of Systems  All other systems reviewed and are negative.   Height 5\' 7"  (1.702 m), weight 70.308 kg (155 lb), last menstrual period 03/13/2014. Physical Exam  Constitutional:  WD, WN, NAD HEENT:  NCAT, EOMI Neuro/Psych:  Alert & oriented to person, place, and time; appropriate mood & affect Lymphatic: No generalized UE edema or lymphadenopathy Extremities / MSK:  Both UE are normal with respect to appearance, ranges of motion, joint stability, muscle strength/tone, sensation, & perfusion except as otherwise noted:  Right:  Monofilament testing 2.83 across all the digits.  With Phalen's maneuver she has symptoms that radiate into the long and ring fingers, with a Tinel sign over both median nerves just proximal to the wrist crease.  Labs / X-rays: No radiographic studies obtained today.  Assessment:  Right carpal tunnel syndrome  Plan:  We reviewed the lateral studies and the details of endoscopic carpal tunnel release.  We will proceed with R ECTR.  The details of the operative procedure were discussed with the patient.  Questions were invited and answered.  In addition to the goal of the procedure, the risks of the procedure to include but not limited to bleeding; infection; damage to the nerves or blood vessels that could result in bleeding, numbness, weakness, chronic pain, and the need for additional procedures; stiffness; the need for revision surgery; and anesthetic risks, the worst of which is death, were  reviewed.  No specific outcome was guaranteed or implied.  Informed consent was obtained.    Malicia Blasdel A. 03/29/2014, 4:41 PM

## 2014-03-31 ENCOUNTER — Encounter (HOSPITAL_BASED_OUTPATIENT_CLINIC_OR_DEPARTMENT_OTHER): Payer: Self-pay

## 2014-03-31 ENCOUNTER — Ambulatory Visit (HOSPITAL_BASED_OUTPATIENT_CLINIC_OR_DEPARTMENT_OTHER): Payer: 59 | Admitting: Certified Registered"

## 2014-03-31 ENCOUNTER — Encounter (HOSPITAL_BASED_OUTPATIENT_CLINIC_OR_DEPARTMENT_OTHER)
Admission: RE | Disposition: A | Discharge status: Home / Self Care | Payer: Self-pay | Source: Ambulatory Visit | Attending: Orthopedic Surgery

## 2014-03-31 ENCOUNTER — Ambulatory Visit (HOSPITAL_BASED_OUTPATIENT_CLINIC_OR_DEPARTMENT_OTHER)
Admission: RE | Admit: 2014-03-31 | Discharge: 2014-03-31 | Disposition: A | Discharge status: Home / Self Care | Payer: 59 | Source: Ambulatory Visit | Attending: Orthopedic Surgery | Admitting: Orthopedic Surgery

## 2014-03-31 DIAGNOSIS — Z887 Allergy status to serum and vaccine status: Secondary | ICD-10-CM | POA: Insufficient documentation

## 2014-03-31 DIAGNOSIS — M13861 Other specified arthritis, right knee: Secondary | ICD-10-CM | POA: Diagnosis not present

## 2014-03-31 DIAGNOSIS — M13862 Other specified arthritis, left knee: Secondary | ICD-10-CM | POA: Diagnosis not present

## 2014-03-31 DIAGNOSIS — L659 Nonscarring hair loss, unspecified: Secondary | ICD-10-CM | POA: Diagnosis not present

## 2014-03-31 DIAGNOSIS — G5601 Carpal tunnel syndrome, right upper limb: Secondary | ICD-10-CM | POA: Insufficient documentation

## 2014-03-31 DIAGNOSIS — Z880 Allergy status to penicillin: Secondary | ICD-10-CM | POA: Insufficient documentation

## 2014-03-31 DIAGNOSIS — Z91040 Latex allergy status: Secondary | ICD-10-CM | POA: Diagnosis not present

## 2014-03-31 DIAGNOSIS — F17210 Nicotine dependence, cigarettes, uncomplicated: Secondary | ICD-10-CM | POA: Insufficient documentation

## 2014-03-31 HISTORY — DX: Carpal tunnel syndrome, right upper limb: G56.01

## 2014-03-31 HISTORY — PX: CARPAL TUNNEL RELEASE: SHX101

## 2014-03-31 LAB — POCT HEMOGLOBIN-HEMACUE: Hemoglobin: 15.1 g/dL — ABNORMAL HIGH (ref 12.0–15.0)

## 2014-03-31 SURGERY — RELEASE, CARPAL TUNNEL, ENDOSCOPIC
Anesthesia: Monitor Anesthesia Care | Site: Wrist | Laterality: Right

## 2014-03-31 MED ORDER — MIDAZOLAM HCL 5 MG/5ML IJ SOLN
INTRAMUSCULAR | Status: DC | PRN
Start: 2014-03-31 — End: 2014-03-31
  Administered 2014-03-31: 2 mg via INTRAVENOUS

## 2014-03-31 MED ORDER — FENTANYL CITRATE 0.05 MG/ML IJ SOLN: 25.0000 ug | INTRAMUSCULAR | Status: DC | PRN

## 2014-03-31 MED ORDER — MEPERIDINE HCL 25 MG/ML IJ SOLN: 6.2500 mg | INTRAMUSCULAR | Status: DC | PRN

## 2014-03-31 MED ORDER — FENTANYL CITRATE 0.05 MG/ML IJ SOLN
INTRAMUSCULAR | Status: AC
  Filled 2014-03-31: qty 2

## 2014-03-31 MED ORDER — MIDAZOLAM HCL 2 MG/2ML IJ SOLN
INTRAMUSCULAR | Status: AC
  Filled 2014-03-31: qty 2

## 2014-03-31 MED ORDER — OXYCODONE HCL 5 MG PO TABS: 5.0000 mg | ORAL_TABLET | Freq: Once | ORAL | Status: DC | PRN

## 2014-03-31 MED ORDER — OXYCODONE HCL 5 MG/5ML PO SOLN: 5.0000 mg | Freq: Once | ORAL | Status: DC | PRN

## 2014-03-31 MED ORDER — ONDANSETRON HCL 4 MG/2ML IJ SOLN
INTRAMUSCULAR | Status: DC | PRN
  Administered 2014-03-31: 4 mg via INTRAVENOUS

## 2014-03-31 MED ORDER — LIDOCAINE HCL 2 % IJ SOLN
INTRAMUSCULAR | Status: DC | PRN
  Administered 2014-03-31: 10 mL

## 2014-03-31 MED ORDER — PROMETHAZINE HCL 25 MG/ML IJ SOLN: 6.2500 mg | INTRAMUSCULAR | Status: DC | PRN

## 2014-03-31 MED ORDER — FENTANYL CITRATE 0.05 MG/ML IJ SOLN: 50.0000 ug | INTRAMUSCULAR | Status: DC | PRN

## 2014-03-31 MED ORDER — OXYCODONE-ACETAMINOPHEN 5-325 MG PO TABS: 1.0000 | ORAL_TABLET | Freq: Four times a day (QID) | ORAL | Status: DC | PRN

## 2014-03-31 MED ORDER — MIDAZOLAM HCL 2 MG/2ML IJ SOLN: 1.0000 mg | INTRAMUSCULAR | Status: DC | PRN

## 2014-03-31 MED ORDER — VANCOMYCIN HCL IN DEXTROSE 1-5 GM/200ML-% IV SOLN
1000.0000 mg | INTRAVENOUS | Status: AC
  Administered 2014-03-31: 1000 mg via INTRAVENOUS

## 2014-03-31 MED ORDER — VANCOMYCIN HCL IN DEXTROSE 1-5 GM/200ML-% IV SOLN
INTRAVENOUS | Status: AC
  Filled 2014-03-31: qty 200

## 2014-03-31 MED ORDER — HYDROMORPHONE HCL 1 MG/ML IJ SOLN
INTRAMUSCULAR | Status: AC
  Filled 2014-03-31: qty 1

## 2014-03-31 MED ORDER — HYDROMORPHONE HCL 1 MG/ML IJ SOLN
0.2500 mg | INTRAMUSCULAR | Status: DC | PRN
  Administered 2014-03-31: 0.5 mg via INTRAVENOUS

## 2014-03-31 MED ORDER — LACTATED RINGERS IV SOLN: INTRAVENOUS | Status: DC

## 2014-03-31 MED ORDER — BUPIVACAINE-EPINEPHRINE 0.5% -1:200000 IJ SOLN
INTRAMUSCULAR | Status: DC | PRN
  Administered 2014-03-31: 5 mL

## 2014-03-31 MED ORDER — PROPOFOL INFUSION 10 MG/ML OPTIME
INTRAVENOUS | Status: DC | PRN
  Administered 2014-03-31: 75 ug/kg/min via INTRAVENOUS

## 2014-03-31 MED ORDER — FENTANYL CITRATE 0.05 MG/ML IJ SOLN
INTRAMUSCULAR | Status: DC | PRN
  Administered 2014-03-31: 50 ug via INTRAVENOUS

## 2014-03-31 MED ORDER — LIDOCAINE HCL (CARDIAC) 20 MG/ML IV SOLN
INTRAVENOUS | Status: DC | PRN
  Administered 2014-03-31: 30 mg via INTRAVENOUS

## 2014-03-31 MED ORDER — MIDAZOLAM HCL 2 MG/2ML IJ SOLN: 0.5000 mg | Freq: Once | INTRAMUSCULAR | Status: DC | PRN

## 2014-03-31 MED ORDER — LACTATED RINGERS IV SOLN
INTRAVENOUS | Status: DC
  Administered 2014-03-31: 11:00:00 via INTRAVENOUS

## 2014-03-31 SURGICAL SUPPLY — 44 items
APPLICATOR COTTON TIP 6IN STRL (MISCELLANEOUS) ×3 IMPLANT
BLADE HOOK ENDO STRL (BLADE) ×3 IMPLANT
BLADE SURG 15 STRL LF DISP TIS (BLADE) ×1 IMPLANT
BLADE SURG 15 STRL SS (BLADE) ×3
BLADE TRIANGLE EPF/EGR ENDO (BLADE) ×3 IMPLANT
BNDG CMPR 9X4 STRL LF SNTH (GAUZE/BANDAGES/DRESSINGS) ×1
BNDG COHESIVE 4X5 TAN STRL (GAUZE/BANDAGES/DRESSINGS) ×3 IMPLANT
BNDG ESMARK 4X9 LF (GAUZE/BANDAGES/DRESSINGS) ×3 IMPLANT
BNDG GAUZE ELAST 4 BULKY (GAUZE/BANDAGES/DRESSINGS) ×5 IMPLANT
CHLORAPREP W/TINT 26ML (MISCELLANEOUS) ×3 IMPLANT
CORDS BIPOLAR (ELECTRODE) IMPLANT
COVER BACK TABLE 60X90IN (DRAPES) ×3 IMPLANT
COVER MAYO STAND STRL (DRAPES) ×3 IMPLANT
CUFF TOURNIQUET SINGLE 18IN (TOURNIQUET CUFF) ×2 IMPLANT
DRAIN TLS ROUND 10FR (DRAIN) ×3 IMPLANT
DRAPE EXTREMITY T 121X128X90 (DRAPE) ×3 IMPLANT
DRAPE SURG 17X23 STRL (DRAPES) ×3 IMPLANT
DRSG EMULSION OIL 3X3 NADH (GAUZE/BANDAGES/DRESSINGS) ×3 IMPLANT
GLOVE BIO SURGEON STRL SZ7.5 (GLOVE) ×1 IMPLANT
GLOVE BIOGEL PI IND STRL 7.0 (GLOVE) ×1 IMPLANT
GLOVE BIOGEL PI IND STRL 8 (GLOVE) ×1 IMPLANT
GLOVE BIOGEL PI INDICATOR 7.0 (GLOVE) ×2
GLOVE BIOGEL PI INDICATOR 8 (GLOVE) ×2
GLOVE ECLIPSE 6.5 STRL STRAW (GLOVE) ×1 IMPLANT
GLOVE SURG SS PI 6.5 STRL IVOR (GLOVE) ×4 IMPLANT
GLOVE SURG SS PI 7.5 STRL IVOR (GLOVE) ×2 IMPLANT
GOWN STRL REUS W/ TWL LRG LVL3 (GOWN DISPOSABLE) ×1 IMPLANT
GOWN STRL REUS W/TWL LRG LVL3 (GOWN DISPOSABLE) ×3
GOWN STRL REUS W/TWL XL LVL3 (GOWN DISPOSABLE) ×3 IMPLANT
NDL HYPO 25X1 1.5 SAFETY (NEEDLE) IMPLANT
NEEDLE HYPO 25X1 1.5 SAFETY (NEEDLE) ×3 IMPLANT
NS IRRIG 1000ML POUR BTL (IV SOLUTION) ×3 IMPLANT
PACK BASIN DAY SURGERY FS (CUSTOM PROCEDURE TRAY) ×3 IMPLANT
PADDING CAST ABS 4INX4YD NS (CAST SUPPLIES)
PADDING CAST ABS COTTON 4X4 ST (CAST SUPPLIES) IMPLANT
SPONGE GAUZE 4X4 12PLY STER LF (GAUZE/BANDAGES/DRESSINGS) ×3 IMPLANT
STOCKINETTE 6  STRL (DRAPES) ×2
STOCKINETTE 6 STRL (DRAPES) ×1 IMPLANT
SUT VICRYL RAPIDE 4-0 (SUTURE) ×3 IMPLANT
SYR BULB 3OZ (MISCELLANEOUS) ×3 IMPLANT
SYRINGE 10CC LL (SYRINGE) IMPLANT
TOWEL OR 17X24 6PK STRL BLUE (TOWEL DISPOSABLE) ×6 IMPLANT
TOWEL OR NON WOVEN STRL DISP B (DISPOSABLE) ×3 IMPLANT
UNDERPAD 30X30 INCONTINENT (UNDERPADS AND DIAPERS) ×3 IMPLANT

## 2014-03-31 NOTE — Transfer of Care (Signed)
Immediate Anesthesia Transfer of Care Note  Patient: Veterinary surgeonChancey Norgaard  Procedure(s) Performed: Procedure(s): RIGHT CARPAL TUNNEL RELEASE ENDOSCOPIC (Right)  Patient Location: PACU  Anesthesia Type:MAC  Level of Consciousness: awake, alert , oriented and patient cooperative  Airway & Oxygen Therapy: Patient Spontanous Breathing and Patient connected to face mask oxygen  Post-op Assessment: Report given to PACU RN and Post -op Vital signs reviewed and stable  Post vital signs: Reviewed and stable  Complications: No apparent anesthesia complications

## 2014-03-31 NOTE — Op Note (Signed)
03/31/2014  10:37 AM  PATIENT:  Jennifer Robinson  38 y.o. female  PRE-OPERATIVE DIAGNOSIS:  RIGHT CARPAL SYNDROME  POST-OPERATIVE DIAGNOSIS:  Same  PROCEDURE:  Procedure(s): RIGHT CARPAL TUNNEL RELEASE ENDOSCOPIC  SURGEON:  Surgeon(s): Jodi Marbleavid A Biruk Troia, MD  PHYSICIAN ASSISTANT: Danielle RankinKirsten Schrader, OPA-C  ANESTHESIA:  Local / MAC  SPECIMENS:  None  DRAINS:   None  PREOPERATIVE INDICATIONS:  Jennifer CottonChancey Lamarca is a  38 y.o. female with a diagnosis of RIGHT CARPAL SYNDROME who failed conservative measures and elected for surgical management.    The risks benefits and alternatives were discussed with the patient preoperatively including but not limited to the risks of infection, bleeding, nerve injury, cardiopulmonary complications, the need for revision surgery, among others, and the patient verbalized understanding and consented to proceed.  OPERATIVE IMPLANTS: None  OPERATIVE FINDINGS: Tight carpal tunnel, acceptably decompressed following release  OPERATIVE PROCEDURE:  After receiving prophylactic antibiotics, the patient was escorted to the operative theatre and placed in a supine position.   A surgical "time-out" was performed during which the planned procedure, proposed operative site, and the correct patient identity were compared to the operative consent and agreement confirmed by the circulating nurse according to current facility policy.  Following application of a tourniquet to the operative extremity, the planned incisions were marked and anesthetized with a mixture of lidocaine & bupivicaine containing epinephrine.  The exposed skin was prepped with Chloraprep and draped in the usual sterile fashion.  The limb was exsanguinated with an Esmarch bandage and the tourniquet inflated to approximately 100mmHg higher than systolic BP.   The incisions were made sharply. Subcutaneous tissues were dissected with blunt and spreading dissection. At the proximal incision, the deep forearm fascia  was split in line with the skin incision the distal edge grasped with a hemostat. At the mid palmar incision, the palmar fascia was split in line with the skin incision, revealing the underlying superficial palmar arch which was visualized with loupe assisted magnification. The synovial reflector was then introduced into the proximal incision and passed through the carpal canal, uses to reflect synovium from the deep surface of the transverse carpal ligament. It was removed and replaced with the slotted cannula and blunt obturator, passing from proximal to distal and exiting the distal wound superficial to the superficial palmar arch. The obturator was removed and the camera inserted. Visualization of the ligament was acceptable. The triangle shape blade was then inserted distally, advanced to the midportion of the ligament, and there used to create a perforation in the ligament. This instrument was removed and the hooked nstrument was inserted. It was placed into the perforation in the ligament and withdrawn distally, completing transection of the distal half of the ligament. The camera was then removed and placed into the distal end of the cannula. The hooked instrument was placed into the proximal end and advanced facility to be placed into the apex of the V. which had been formed to the distal hemi-transection of the ligament. It was withdrawn proximally, completing transection of the ligament. The adequacy of the release was judged with the scope and the instruments used as a probe. All of the endoscopic instruments are removed and the adequacy of release was again judged from the proximal incision perspective with direct loupe assisted visualization. In addition, the proximal forearm fascia was split for 2 inches proximal to the proximal incision under direct visualization using a sliding scissor technique. The tourniquet was released, the wound copiously irrigated. A TLS drain was placed  to lie alongside the  nerve exiting its own skin hole distally made with some sharp trocar. The skin was then closed with 4-0 Vicryl Rapide interrupted sutures. A light dressing was applied and the balance of 10 mL of the initial anesthetic mixture was placed down the drain, and the drain was clamped to be unclamped in the recovery room.  DISPOSITION:  The patient will be discharged home today, returning in 10-15 days for re-assessment.

## 2014-03-31 NOTE — Interval H&P Note (Signed)
History and Physical Interval Note:  03/31/2014 10:36 AM  Jennifer Robinson  has presented today for surgery, with the diagnosis of RIGHT CARPAL SYNDROME  The various methods of treatment have been discussed with the patient and family. After consideration of risks, benefits and other options for treatment, the patient has consented to  Procedure(s): RIGHT CARPAL TUNNEL RELEASE ENDOSCOPIC (Right) as a surgical intervention .  The patient's history has been reviewed, patient examined, no change in status, stable for surgery.  I have reviewed the patient's chart and labs.  Questions were answered to the patient's satisfaction.     Katalena Malveaux A.

## 2014-03-31 NOTE — Anesthesia Procedure Notes (Signed)
Procedure Name: MAC Date/Time: 03/31/2014 10:56 AM Performed by: Jovaun Levene Pre-anesthesia Checklist: Patient identified, Emergency Drugs available, Suction available, Patient being monitored and Timeout performed Patient Re-evaluated:Patient Re-evaluated prior to inductionOxygen Delivery Method: Simple face mask

## 2014-03-31 NOTE — Anesthesia Postprocedure Evaluation (Signed)
Anesthesia Post Note  Patient: [name]  Procedure(s) Performed: Procedure(s) (LRB): RIGHT CARPAL TUNNEL RELEASE ENDOSCOPIC (Right)  Anesthesia type: MAC  Patient location: PACU  Post pain: Pain level controlled  Post assessment: Patient's Cardiovascular Status Stable  Last Vitals:  Filed Vitals:   03/31/14 1157  BP:   Pulse: 82  Temp:   Resp: 16    Post vital signs: Reviewed and stable  Level of consciousness: sedated  Complications: No apparent anesthesia complications

## 2014-03-31 NOTE — Anesthesia Preprocedure Evaluation (Addendum)
Anesthesia Evaluation  Patient identified by MRN, date of birth, ID band Patient awake    Reviewed: Allergy & Precautions, H&P , NPO status , Patient's Chart, lab work & pertinent test results  History of Anesthesia Complications (+) PONVNegative for: history of anesthetic complications  Airway Mallampati: II  TM Distance: >3 FB Neck ROM: Full    Dental  (+) Teeth Intact, Dental Advisory Given   Pulmonary Current Smoker,    Pulmonary exam normal       Cardiovascular negative cardio ROS      Neuro/Psych negative neurological ROS  negative psych ROS   GI/Hepatic negative GI ROS, Neg liver ROS,   Endo/Other  negative endocrine ROS  Renal/GU negative Renal ROS     Musculoskeletal   Abdominal   Peds  Hematology   Anesthesia Other Findings   Reproductive/Obstetrics negative OB ROS                           Anesthesia Physical Anesthesia Plan  ASA: II  Anesthesia Plan: MAC   Post-op Pain Management:    Induction:   Airway Management Planned: Simple Face Mask  Additional Equipment:   Intra-op Plan:   Post-operative Plan:   Informed Consent: I have reviewed the patients History and Physical, chart, labs and discussed the procedure including the risks, benefits and alternatives for the proposed anesthesia with the patient or authorized representative who has indicated his/her understanding and acceptance.   Dental advisory given  Plan Discussed with: CRNA, Anesthesiologist and Surgeon  Anesthesia Plan Comments:        Anesthesia Quick Evaluation

## 2014-03-31 NOTE — Discharge Instructions (Addendum)
Discharge Instructions ° ° °You have a light dressing on your hand.  °You may begin gentle motion of your fingers and hand immediately, but you should not do any heavy lifting or gripping.  Elevate your hand to reduce pain & swelling of the digits.  Ice over the operative site may be helpful to reduce pain & swelling.  DO NOT USE HEAT. °Pain medicine has been prescribed for you.  °Use your medicine as needed over the first 48 hours, and then you can begin to taper your use. You may use Tylenol in place of your prescribed pain medication, but not IN ADDITION to it. °Leave the dressing in place until the third day after your surgery and then remove it, leaving it open to air.  °After the bandage has been removed you may shower, but do not soak the incision.  °You may drive a car when you are off of prescription pain medications and can safely control your vehicle with both hands. °We will address whether therapy will be required or not when you return to the office. °You may have already made your follow-up appointment when we completed your preop visit.  If not, please call our office today or the next business day to make your return appointment for 10-15 days after surgery. ° ° °Please call [phone] during normal business hours or [phone] after hours for any problems. Including the following: ° °- excessive redness of the incisions °- drainage for more than 4 days °- fever of more than 101.5 F ° °*Please note that pain medications will not be refilled after hours or on weekends. ° ° °TLS Drain Instructions °You have a drain tube in place to help limit the amount of blood that collects under your skin in the early post-operative period.  The amount it drains will vary from person-to-person and is dependent upon many factors.  You will have 1-2 extra drain tubes sent with you from the facility.  You should change the tube according to these instructions below when the tube is about half full. ° °BE SURE TO  SLIDE THE CLAMP TO THE CLOSED POSITION BEFORE CHANGING THE TUBE.   ° °RE-OPEN THE CLAMP ONCE THE GLASS EVACUATION TUBE HAS BEEN CHANGED. ° °24 hours after surgery the amount of drainage will likely be negligible and it will be time to remove the tube and discard it.  Simply remove the tape that secures the flexible plastic drainage tube to the bandage and briskly pull the tube straight out.  You will likely feel a little discomfort during this process, but the tube should slide out as it is not sewn or otherwise secured directly to your body.  It is merely held in place by the bandage itself.  °                           ° °If you are not clear about when your first post-op appointment is scheduled, please call the office on the next business day to inquire about it.  Please also call the office if you have any other questions ([phone]).   ° °Post Anesthesia Home Care Instructions ° °Activity: °Get plenty of rest for the remainder of the day. A responsible adult should stay with you for 24 hours following the procedure.  °For the next 24 hours, DO NOT: °-Drive a car °-Operate machinery °-Drink alcoholic beverages °-Take any medication unless instructed by your physician °-Make any legal decisions or   sign important papers. ° °Meals: °Start with liquid foods such as gelatin or soup. Progress to regular foods as tolerated. Avoid greasy, spicy, heavy foods. If nausea and/or vomiting occur, drink only clear liquids until the nausea and/or vomiting subsides. Call your physician if vomiting continues. ° °Special Instructions/Symptoms: °Your throat may feel dry or sore from the anesthesia or the breathing tube placed in your throat during surgery. If this causes discomfort, gargle with warm salt water. The discomfort should disappear within 24 hours. ° °

## 2014-04-01 ENCOUNTER — Encounter (HOSPITAL_BASED_OUTPATIENT_CLINIC_OR_DEPARTMENT_OTHER): Payer: Self-pay | Admitting: Orthopedic Surgery

## 2016-08-30 ENCOUNTER — Encounter: Payer: Self-pay | Admitting: Sports Medicine

## 2016-08-30 ENCOUNTER — Ambulatory Visit (INDEPENDENT_AMBULATORY_CARE_PROVIDER_SITE_OTHER): Payer: 59 | Admitting: Sports Medicine

## 2016-08-30 ENCOUNTER — Other Ambulatory Visit: Payer: Self-pay

## 2016-08-30 ENCOUNTER — Ambulatory Visit (INDEPENDENT_AMBULATORY_CARE_PROVIDER_SITE_OTHER): Payer: 59

## 2016-08-30 DIAGNOSIS — S2232XA Fracture of one rib, left side, initial encounter for closed fracture: Secondary | ICD-10-CM

## 2016-08-30 DIAGNOSIS — F17200 Nicotine dependence, unspecified, uncomplicated: Secondary | ICD-10-CM

## 2016-08-30 DIAGNOSIS — S299XXA Unspecified injury of thorax, initial encounter: Secondary | ICD-10-CM

## 2016-08-30 DIAGNOSIS — W19XXXA Unspecified fall, initial encounter: Secondary | ICD-10-CM

## 2016-08-30 DIAGNOSIS — S2239XA Fracture of one rib, unspecified side, initial encounter for closed fracture: Secondary | ICD-10-CM | POA: Insufficient documentation

## 2016-08-30 MED ORDER — VARENICLINE TARTRATE 1 MG PO TABS: 1.0000 mg | ORAL_TABLET | Freq: Two times a day (BID) | ORAL | 1 refills | Status: DC

## 2016-08-30 MED ORDER — VARENICLINE TARTRATE 0.5 MG X 11 & 1 MG X 42 PO MISC: ORAL | 0 refills | Status: DC

## 2016-08-30 MED ORDER — HYDROCODONE-ACETAMINOPHEN 5-325 MG PO TABS: 1.0000 | ORAL_TABLET | Freq: Two times a day (BID) | ORAL | 0 refills | Status: DC | PRN

## 2016-08-30 NOTE — Progress Notes (Addendum)
  Subjective:    CC: Fall  HPI: This is a pleasant 41 year old female, earlier she had a fall into her left chest wall onto a slab concrete. She had pain, bruising, and mild difficulty taking deep breaths. Moderate, persistent, no hemoptysis.  Smoker: Motivated to quit, please see assessment and plan for further details.  Past medical history:  Negative.  See flowsheet/record as well for more information.  Surgical history: Negative.  See flowsheet/record as well for more information.  Family history: Negative.  See flowsheet/record as well for more information.  Social history: Negative.  See flowsheet/record as well for more information.  Allergies, and medications have been entered into the medical record, reviewed, and no changes needed.   Review of Systems: No fevers, chills, night sweats, weight loss, chest pain, or shortness of breath.   Objective:    General: Well Developed, well nourished, and in no acute distress.  Neuro: Alert and oriented x3, extra-ocular muscles intact, sensation grossly intact.  HEENT: Normocephalic, atraumatic, pupils equal round reactive to light, neck supple, no masses, no lymphadenopathy, thyroid nonpalpable.  Skin: Warm and dry, no rashes. Cardiac: Regular rate and rhythm, no murmurs rubs or gallops, no lower extremity edema.  Respiratory: Clear to auscultation bilaterally. Not using accessory muscles, speaking in full sentences. Chest wall: Bruised, tender to palpation on multiple ribs on the left side at the anterior axillary line.  Chest was strapped with compressive dressing.  Impression and Recommendations:    Chest trauma Left sided chest trauma with tenderness along the anterior axillary line of multiple ribs. Rib binder placed, hydrocodone for pain, left-sided rib series x-rays.  I spent 25 minutes with this patient, greater than 50% was face-to-face time counseling regarding the above diagnoses, this was separate from the time spent  applying the chest wrapping.

## 2016-08-30 NOTE — Assessment & Plan Note (Signed)
Left sided chest trauma with tenderness along the anterior axillary line of multiple ribs. Rib binder placed, hydrocodone for pain, left-sided rib series x-rays.

## 2016-09-02 DIAGNOSIS — F17200 Nicotine dependence, unspecified, uncomplicated: Secondary | ICD-10-CM | POA: Insufficient documentation

## 2016-09-02 NOTE — Assessment & Plan Note (Signed)
Motivated to quit, has tried behavioral therapy, gum, patches, without success. At this point she is agreeable to try Chantix for smoking cessation, this is medically necessary considering her current rib fracture.

## 2016-09-06 ENCOUNTER — Telehealth: Payer: Self-pay | Admitting: *Deleted

## 2016-09-06 NOTE — Telephone Encounter (Signed)
Pre Authorization sent to cover my meds. Jennifer Robinson

## 2016-09-07 NOTE — Telephone Encounter (Signed)
Insurance is denying coverage for chantix. Patient has not tried and failed bupropion. They want her to have tried this before covering chantix

## 2016-09-08 MED ORDER — BUPROPION HCL ER (SMOKING DET) 150 MG PO TB12: ORAL_TABLET | ORAL | 3 refills | Status: DC

## 2016-09-08 NOTE — Telephone Encounter (Signed)
Message left on patients vm 

## 2016-09-08 NOTE — Telephone Encounter (Signed)
I have called in bupropion, please let patient know her insurance will not cover Chantix and because of this she has a terrible insurance plan.

## 2016-09-15 ENCOUNTER — Telehealth: Payer: Self-pay | Admitting: *Deleted

## 2016-09-15 NOTE — Telephone Encounter (Signed)
Pre Authorization sent to cover my meds.  [id number]

## 2016-09-16 NOTE — Telephone Encounter (Addendum)
Jennifer Robinson, your prior phone note says she needs to try bupropion (zyban) cuz chantix wouldn't be approved.  Now they wont approve welbutrin either?

## 2016-09-16 NOTE — Telephone Encounter (Signed)
Pre auth for zyban denied due to plan exclusion. This medication will not be covered. Pharmacy notified.

## 2016-09-19 NOTE — Telephone Encounter (Signed)
Zyban is a policy exclusion but I went back and did a PA for bupropion the same day because I figured brand Zyban would not be approved. Bupropion is on the patients formulary. Jennifer Robinson was helping out that and she wasn't aware that a PA had been done for Bupropion and the zyban denial was irrelevant.

## 2016-09-19 NOTE — Telephone Encounter (Signed)
Outcome  N/Aon April 26  This medication is on your plan's list of covered drugs. Prior authorization is not required at this time. If your pharmacy has questions regarding the processing of your prescription, please have them call the [facility] pharmacy help desk at [phone]. For additional information, the member can contact Member Services by calling the number on the back of their ID Card.

## 2016-09-27 ENCOUNTER — Encounter: Payer: Self-pay | Admitting: Sports Medicine

## 2016-09-27 ENCOUNTER — Ambulatory Visit (INDEPENDENT_AMBULATORY_CARE_PROVIDER_SITE_OTHER): Payer: 59 | Admitting: Sports Medicine

## 2016-09-27 DIAGNOSIS — S2232XD Fracture of one rib, left side, subsequent encounter for fracture with routine healing: Secondary | ICD-10-CM

## 2016-09-27 DIAGNOSIS — F17200 Nicotine dependence, unspecified, uncomplicated: Secondary | ICD-10-CM | POA: Diagnosis not present

## 2016-09-27 NOTE — Telephone Encounter (Signed)
Has now tried and failed Wellbutrin/bupropion, Sue Lushndrea will you please resubmit the Chantix?

## 2016-09-27 NOTE — Assessment & Plan Note (Signed)
After one month with chest strapping and pain medications rib fracture pain has essentially resolved.

## 2016-09-27 NOTE — Assessment & Plan Note (Signed)
Has now tried and failed Wellbutrin/bupropion, I will ask Sue Lushndrea to resubmit the Chantix now.

## 2016-09-27 NOTE — Progress Notes (Signed)
  Subjective:    CC: Follow-up  HPI: Rib fracture: Essentially resolved after 4 weeks.  Smoker: Chantix was not covered, we tried Wellbutrin for a while, this was not tolerated well so we will resubmit Chantix.  Preventive measures: Will return for a physical exam next month.  Past medical history:  Negative.  See flowsheet/record as well for more information.  Surgical history: Negative.  See flowsheet/record as well for more information.  Family history: Negative.  See flowsheet/record as well for more information.  Social history: Negative.  See flowsheet/record as well for more information.  Allergies, and medications have been entered into the medical record, reviewed, and no changes needed.   Review of Systems: No fevers, chills, night sweats, weight loss, chest pain, or shortness of breath.   Objective:    General: Well Developed, well nourished, and in no acute distress.  Neuro: Alert and oriented x3, extra-ocular muscles intact, sensation grossly intact.  HEENT: Normocephalic, atraumatic, pupils equal round reactive to light, neck supple, no masses, no lymphadenopathy, thyroid nonpalpable.  Skin: Warm and dry, no rashes. Cardiac: Regular rate and rhythm, no murmurs rubs or gallops, no lower extremity edema.  Respiratory: Clear to auscultation bilaterally. Not using accessory muscles, speaking in full sentences.  Impression and Recommendations:    Fracture of rib, closed After one month with chest strapping and pain medications rib fracture pain has essentially resolved.  Smoker Has now tried and failed Wellbutrin/bupropion, I will ask Sue Lushndrea to resubmit the Chantix now.

## 2016-10-12 ENCOUNTER — Encounter: Payer: Self-pay | Admitting: *Deleted

## 2016-10-12 NOTE — Telephone Encounter (Signed)
Appeal sent.

## 2016-10-21 ENCOUNTER — Encounter: Payer: Self-pay | Admitting: *Deleted

## 2016-10-25 ENCOUNTER — Encounter: Payer: Self-pay | Admitting: Physician Assistant

## 2016-10-25 ENCOUNTER — Ambulatory Visit (INDEPENDENT_AMBULATORY_CARE_PROVIDER_SITE_OTHER): Payer: 59 | Admitting: Physician Assistant

## 2016-10-25 VITALS — BP 128/78 | HR 98 | Ht 67.0 in | Wt 174.0 lb

## 2016-10-25 DIAGNOSIS — Z716 Tobacco abuse counseling: Secondary | ICD-10-CM

## 2016-10-25 DIAGNOSIS — Z720 Tobacco use: Secondary | ICD-10-CM | POA: Diagnosis not present

## 2016-10-25 DIAGNOSIS — Z0000 Encounter for general adult medical examination without abnormal findings: Secondary | ICD-10-CM | POA: Diagnosis not present

## 2016-10-25 DIAGNOSIS — Z13220 Encounter for screening for lipoid disorders: Secondary | ICD-10-CM | POA: Diagnosis not present

## 2016-10-25 DIAGNOSIS — Z131 Encounter for screening for diabetes mellitus: Secondary | ICD-10-CM

## 2016-10-25 MED ORDER — VARENICLINE TARTRATE 1 MG PO TABS: 1.0000 mg | ORAL_TABLET | Freq: Two times a day (BID) | ORAL | 1 refills | Status: DC

## 2016-10-25 MED ORDER — VARENICLINE TARTRATE 0.5 MG X 11 & 1 MG X 42 PO MISC: ORAL | 0 refills | Status: DC

## 2016-10-25 NOTE — Progress Notes (Signed)
Subjective:    Patient ID: Jennifer Robinson, female    DOB: 25-Jul-1975, 41 y.o.   MRN: [id number]  HPI Pt is a 41 yo female who presents to the clinic for CPE. She lives here in [place] and part time in [place]. She only has GYN in [place] and not had labs done in years. She just wants labs and to stop smoking.   She smokes about 1 pack a day. She cannot tolerate wellbutrin due to sexual side effects. She is interested in chantix.   .. Active Ambulatory Problems    Diagnosis Date Noted  . Bilateral carpal tunnel syndrome 03/29/2013  . Right patellofemoral syndrome 03/29/2013  . Hidradenitis suppurativa of anus 07/05/2013  . Fracture of rib, closed 08/30/2016  . Smoker 09/02/2016   Resolved Ambulatory Problems    Diagnosis Date Noted  . No Resolved Ambulatory Problems   Past Medical History:  Diagnosis Date  . Alopecia   . Arthritis   . Carpal tunnel syndrome of right wrist 03/2014  . Dental crown present   . Seasonal allergies   . Wears partial dentures    .Marland Kitchen. Family History  Problem Relation Age of Onset  . Cancer Mother        ovarian, colon CA  . Hyperlipidemia Father   . Heart failure Father         Review of Systems  All other systems reviewed and are negative.      Objective:   Physical Exam  Constitutional: She is oriented to person, place, and time. She appears well-developed and well-nourished.  Overweight.   HENT:  Head: Normocephalic and atraumatic.  Right Ear: External ear normal.  Left Ear: External ear normal.  Nose: Nose normal.  Mouth/Throat: Oropharynx is clear and moist. No oropharyngeal exudate.  Eyes: Conjunctivae and EOM are normal. Pupils are equal, round, and reactive to light.  Neck: Normal range of motion. No thyromegaly present.  Cardiovascular: Normal rate, regular rhythm and normal heart sounds.   Pulmonary/Chest: Effort normal and breath sounds normal.  Abdominal: Soft. Bowel sounds are normal. She exhibits no distension and no mass. There is  no tenderness. There is no rebound and no guarding.  Lymphadenopathy:    She has no cervical adenopathy.  Neurological: She is alert and oriented to person, place, and time.  Psychiatric: She has a normal mood and affect. Her behavior is normal.          Assessment & Plan:  Marland Kitchen.Marland Kitchen.Jennifer Robinson was seen today for annual exam.  Diagnoses and all orders for this visit:  Routine physical examination -     Lipid panel -     COMPLETE METABOLIC PANEL WITH GFR -     TSH  Screening for lipid disorders -     Lipid panel  Screening for diabetes mellitus -     COMPLETE METABOLIC PANEL WITH GFR  Encounter for smoking cessation counseling -     varenicline (CHANTIX STARTING MONTH PAK) 0.5 MG X 11 & 1 MG X 42 tablet; Take one 0.5mg  tablet by mouth once daily for 3 days, then increase to one 0.5mg  tablet twice daily for 3 days, then increase to one 1mg  tablet twice daily. -     varenicline (CHANTIX) 1 MG tablet; Take 1 tablet (1 mg total) by mouth 2 (two) times daily.  .. [facility]  Decreased Interest 0  Down, Depressed, Hopeless 0  PHQ - 2 Score 0    Discussed weight loss.  Side effects of chantix and to stop smoking in one month.  Encouraged 150 minutes of exercise a week.

## 2016-10-25 NOTE — Patient Instructions (Signed)

## 2016-10-26 ENCOUNTER — Encounter: Payer: Self-pay | Admitting: Physician Assistant

## 2016-10-26 LAB — COMPLETE METABOLIC PANEL WITH GFR
ALK PHOS: 63 U/L (ref 33–115)
ALT: 10 U/L (ref 6–29)
AST: 14 U/L (ref 10–30)
Albumin: 4.3 g/dL (ref 3.6–5.1)
BILIRUBIN TOTAL: 0.4 mg/dL (ref 0.2–1.2)
BUN: 14 mg/dL (ref 7–25)
CO2: 19 mmol/L — AB (ref 20–31)
Calcium: 9.5 mg/dL (ref 8.6–10.2)
Chloride: 104 mmol/L (ref 98–110)
Creat: 0.8 mg/dL (ref 0.50–1.10)
Glucose, Bld: 98 mg/dL (ref 65–99)
POTASSIUM: 4.1 mmol/L (ref 3.5–5.3)
Sodium: 137 mmol/L (ref 135–146)
TOTAL PROTEIN: 7.4 g/dL (ref 6.1–8.1)

## 2016-10-26 LAB — LIPID PANEL
CHOL/HDL RATIO: 5.6 ratio — AB (ref <5.0)
Cholesterol: 250 mg/dL — ABNORMAL HIGH (ref <200)
HDL: 45 mg/dL — AB (ref >50)
LDL CALC: 163 mg/dL — AB (ref <100)
TRIGLYCERIDES: 208 mg/dL — AB (ref <150)
VLDL: 42 mg/dL — ABNORMAL HIGH (ref <30)

## 2016-10-26 LAB — TSH: TSH: 3.98 mIU/L

## 2016-10-27 ENCOUNTER — Encounter: Payer: Self-pay | Admitting: Physician Assistant

## 2016-10-27 DIAGNOSIS — E785 Hyperlipidemia, unspecified: Secondary | ICD-10-CM | POA: Insufficient documentation

## 2016-10-27 NOTE — Telephone Encounter (Signed)
Received letter stating chantix was approved on 10/21/2016-10/21/2017. [id number] CEU. Message left on pharmacy and patient vm

## 2018-08-02 ENCOUNTER — Ambulatory Visit (INDEPENDENT_AMBULATORY_CARE_PROVIDER_SITE_OTHER): Payer: 59 | Admitting: Family Medicine

## 2018-08-02 ENCOUNTER — Other Ambulatory Visit: Payer: Self-pay

## 2018-08-02 ENCOUNTER — Encounter: Payer: Self-pay | Admitting: Family Medicine

## 2018-08-02 VITALS — BP 136/84 | HR 104 | Temp 98.4°F

## 2018-08-02 DIAGNOSIS — Z634 Disappearance and death of family member: Secondary | ICD-10-CM | POA: Diagnosis not present

## 2018-08-02 DIAGNOSIS — F4321 Adjustment disorder with depressed mood: Secondary | ICD-10-CM | POA: Diagnosis not present

## 2018-08-02 MED ORDER — CLONAZEPAM 1 MG PO TABS: 0.5000 mg | ORAL_TABLET | Freq: Two times a day (BID) | ORAL | 0 refills | Status: DC
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
## 2018-08-02 NOTE — Patient Instructions (Signed)
Electronic medical record was unavailable during encounter.  Patient instructions were handwritten.

## 2018-09-05 ENCOUNTER — Telehealth: Payer: Self-pay | Admitting: Physician Assistant

## 2018-09-05 ENCOUNTER — Ambulatory Visit (INDEPENDENT_AMBULATORY_CARE_PROVIDER_SITE_OTHER): Payer: 59 | Admitting: Professional

## 2018-09-05 DIAGNOSIS — F430 Acute stress reaction: Secondary | ICD-10-CM

## 2018-09-06 ENCOUNTER — Encounter: Payer: Self-pay | Admitting: Physician Assistant

## 2018-09-06 NOTE — Telephone Encounter (Signed)
error 

## 2018-09-07 ENCOUNTER — Encounter: Payer: Self-pay | Admitting: Physician Assistant

## 2018-09-07 ENCOUNTER — Other Ambulatory Visit: Payer: Self-pay | Admitting: Neurology

## 2018-09-07 ENCOUNTER — Telehealth (INDEPENDENT_AMBULATORY_CARE_PROVIDER_SITE_OTHER): Payer: 59 | Admitting: Physician Assistant

## 2018-09-07 VITALS — BP 100/77 | HR 119 | Ht 67.0 in | Wt 181.0 lb

## 2018-09-07 DIAGNOSIS — F430 Acute stress reaction: Secondary | ICD-10-CM

## 2018-09-07 DIAGNOSIS — F4321 Adjustment disorder with depressed mood: Secondary | ICD-10-CM | POA: Diagnosis not present

## 2018-09-07 DIAGNOSIS — F321 Major depressive disorder, single episode, moderate: Secondary | ICD-10-CM

## 2018-09-07 DIAGNOSIS — Z634 Disappearance and death of family member: Secondary | ICD-10-CM | POA: Diagnosis not present

## 2018-09-07 MED ORDER — ESZOPICLONE 2 MG PO TABS: 2.0000 mg | ORAL_TABLET | Freq: Every evening | ORAL | 1 refills | Status: DC | PRN

## 2018-09-07 MED ORDER — CLONAZEPAM 1 MG PO TABS: 0.5000 mg | ORAL_TABLET | Freq: Two times a day (BID) | ORAL | 0 refills | Status: DC

## 2018-09-07 NOTE — Progress Notes (Signed)
Patient ID: Jennifer CottonChancey Aragones, female   DOB: 1976/03/27, 43 y.o.   MRN: [id number] .Marland Kitchen.Virtual Visit via Video Note  I connected with Jennifer Robinson on 09/08/18 at  1:20 PM EDT by a video enabled telemedicine application and verified that I am speaking with the correct person using two identifiers.   I discussed the limitations of evaluation and management by telemedicine and the availability of in person appointments. The patient expressed understanding and agreed to proceed.  History of Present Illness: Pt is a 43 yo female with recent loss of daughter on march 10th to suicide. She comes in for follow up. She is not doing great. She actually has gone back to work. She is working from home during the COVID pandemic. She is using klonapin daily with some help. She is going to counseling weekly which is helping some too. She is not sleeping well and has had lunesta in the past and wondered if she could try again. Pt denies any SI/HC.  Marland Kitchen.. Active Ambulatory Problems    Diagnosis Date Noted  . Bilateral carpal tunnel syndrome 03/29/2013  . Right patellofemoral syndrome 03/29/2013  . Hidradenitis suppurativa of anus 07/05/2013  . Fracture of rib, closed 08/30/2016  . Smoker 09/02/2016  . Dyslipidemia (high LDL; low HDL) 10/27/2016  . Grief at loss of child 08/02/2018  . Depression, major, single episode, moderate (HCC) 09/08/2018  . Acute stress reaction 09/08/2018   Resolved Ambulatory Problems    Diagnosis Date Noted  . No Resolved Ambulatory Problems   Past Medical History:  Diagnosis Date  . Alopecia   . Arthritis   . Carpal tunnel syndrome of right wrist 03/2014  . Dental crown present   . Seasonal allergies   . Wears partial dentures    Reviewed med, allergy, problem list.    Observations/Objective: No acute distress. Tearful with flat affect. .. [facility] 2/9 09/07/2018 08/02/2018 10/25/2016  Decreased Interest 1 2 0  Down, Depressed, Hopeless 1 2 0  PHQ - 2 Score 2 4 0   Altered sleeping 3 3 -  Tired, decreased energy 1 2 -  Change in appetite 0 2 -  Feeling bad or failure about yourself  1 1 -  Trouble concentrating 1 2 -  Moving slowly or fidgety/restless 0 0 -  Suicidal thoughts 0 0 -  PHQ-9 Score 8 14 -  Difficult doing work/chores Somewhat difficult Very difficult -   .. GAD 7 : Generalized Anxiety Score 09/07/2018 08/02/2018  Nervous, Anxious, on Edge 2 2  Control/stop worrying 2 2  Worry too much - different things 2 2  Trouble relaxing 2 2  Restless 0 1  Easily annoyed or irritable 1 0  Afraid - awful might happen 0 0  Total GAD 7 Score 9 9  Anxiety Difficulty Very difficult Somewhat difficult      Assessment and Plan: Marland Kitchen.Marland Kitchen.[place] was seen today for depression and insomnia.  Diagnoses and all orders for this visit:  Grief at loss of child -     clonazePAM (KLONOPIN) 1 MG tablet; Take 0.5-1 tablets (0.5-1 mg total) by mouth 2 (two) times daily.  Acute stress reaction -     clonazePAM (KLONOPIN) 1 MG tablet; Take 0.5-1 tablets (0.5-1 mg total) by mouth 2 (two) times daily.  Depression, major, single episode, moderate (HCC)  Other orders -     Discontinue: eszopiclone (LUNESTA) 2 MG TABS tablet; Take 1 tablet (2 mg total) by mouth at bedtime as needed for sleep. Take  immediately before bedtime   Discussed grieving and the long process that it is. She is in counseling weekly which is really good for her. She has looked into some support groups for people who have gone through similar things. Refilled klonapin but discussed needed to start using more and more PRN. There can be a dependency issue with benzos. If she is finding she is needing daily need to consider SSRI for daily use. Ok with start lunesta for sleep.   Follow up in 4 weeks.   Follow Up Instructions:    I discussed the assessment and treatment plan with the patient. The patient was provided an opportunity to ask questions and all were answered. The patient agreed with  the plan and demonstrated an understanding of the instructions.   The patient was advised to call back or seek an in-person evaluation if the symptoms worsen or if the condition fails to improve as anticipated.  I provided 25 minutes of non-face-to-face time during this encounter.   Tandy Gaw, PA-C

## 2018-09-07 NOTE — Telephone Encounter (Signed)
Patient called back stating [facility] [address] is out of [place] and asked that we [facility] [address]   Jade - can you please sign RX?
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
## 2018-09-07 NOTE — Telephone Encounter (Signed)
Patient made aware RX sent.  

## 2018-09-08 ENCOUNTER — Encounter: Payer: Self-pay | Admitting: Physician Assistant

## 2018-09-08 DIAGNOSIS — F321 Major depressive disorder, single episode, moderate: Secondary | ICD-10-CM | POA: Insufficient documentation

## 2018-09-08 DIAGNOSIS — F430 Acute stress reaction: Secondary | ICD-10-CM | POA: Insufficient documentation

## 2018-09-12 ENCOUNTER — Ambulatory Visit (INDEPENDENT_AMBULATORY_CARE_PROVIDER_SITE_OTHER): Payer: 59 | Admitting: Professional

## 2018-09-12 DIAGNOSIS — F430 Acute stress reaction: Secondary | ICD-10-CM

## 2018-09-19 ENCOUNTER — Ambulatory Visit (INDEPENDENT_AMBULATORY_CARE_PROVIDER_SITE_OTHER): Payer: 59 | Admitting: Professional

## 2018-09-19 DIAGNOSIS — F430 Acute stress reaction: Secondary | ICD-10-CM | POA: Diagnosis not present

## 2018-09-27 ENCOUNTER — Ambulatory Visit (INDEPENDENT_AMBULATORY_CARE_PROVIDER_SITE_OTHER): Payer: 59 | Admitting: Professional

## 2018-09-27 DIAGNOSIS — F4310 Post-traumatic stress disorder, unspecified: Secondary | ICD-10-CM

## 2018-10-04 ENCOUNTER — Ambulatory Visit (INDEPENDENT_AMBULATORY_CARE_PROVIDER_SITE_OTHER): Payer: 59 | Admitting: Professional

## 2018-10-04 DIAGNOSIS — F430 Acute stress reaction: Secondary | ICD-10-CM

## 2018-10-17 ENCOUNTER — Ambulatory Visit (INDEPENDENT_AMBULATORY_CARE_PROVIDER_SITE_OTHER): Payer: 59 | Admitting: Professional

## 2018-10-17 DIAGNOSIS — F430 Acute stress reaction: Secondary | ICD-10-CM | POA: Diagnosis not present

## 2018-10-31 ENCOUNTER — Ambulatory Visit (INDEPENDENT_AMBULATORY_CARE_PROVIDER_SITE_OTHER): Payer: 59 | Admitting: Professional

## 2018-10-31 DIAGNOSIS — F430 Acute stress reaction: Secondary | ICD-10-CM

## 2018-11-20 IMAGING — DX DG RIBS W/ CHEST 3+V*L*
5 series · 5 of 5 positions shown · non-contrast
Comparison: None.

CLINICAL DATA: Fall, left rib pain

EXAM:
LEFT RIBS AND CHEST - 3+ VIEW

[chest pa]
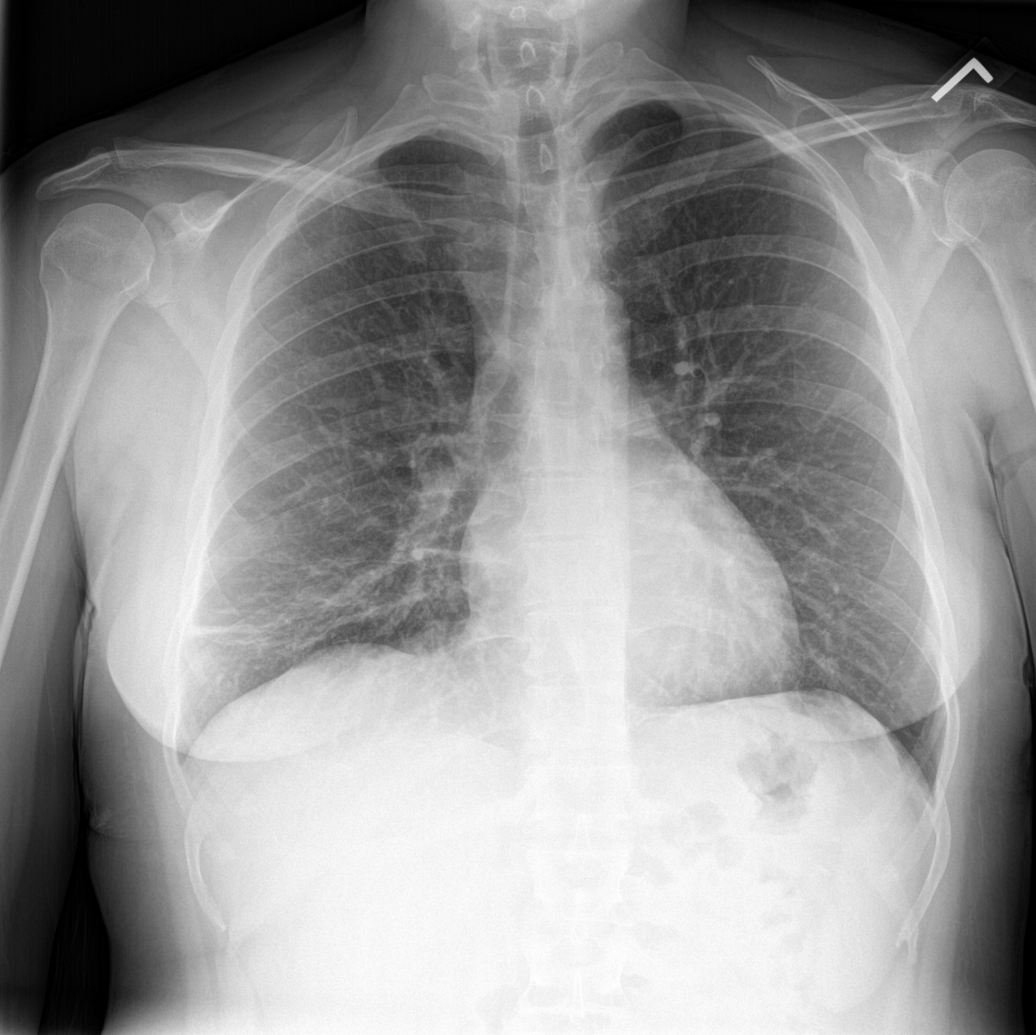

[rib pa]
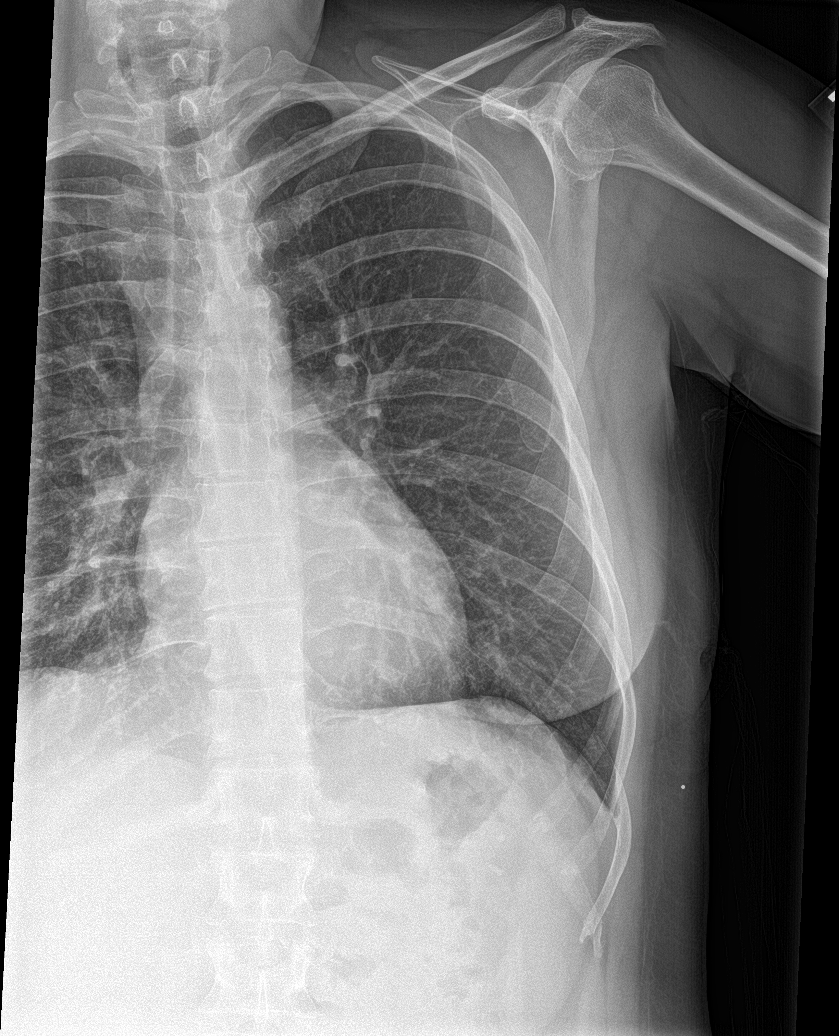

[rib ap]
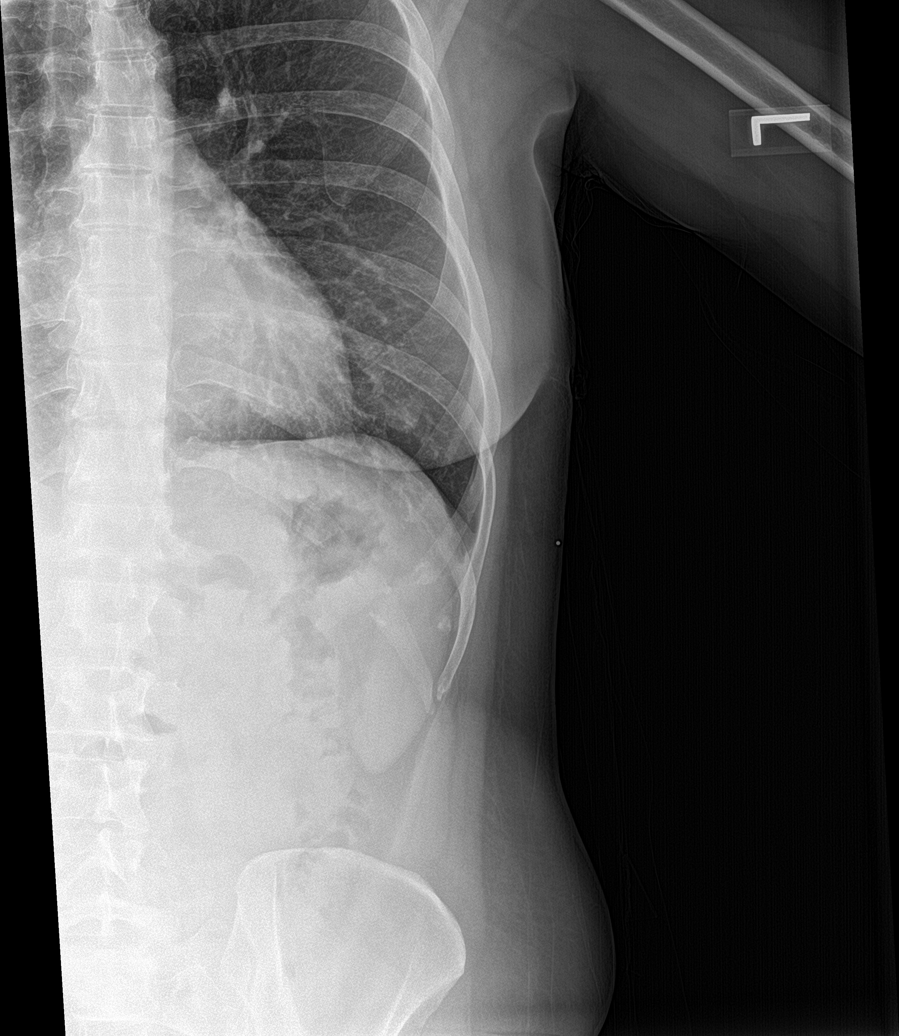

[rib pa obl]
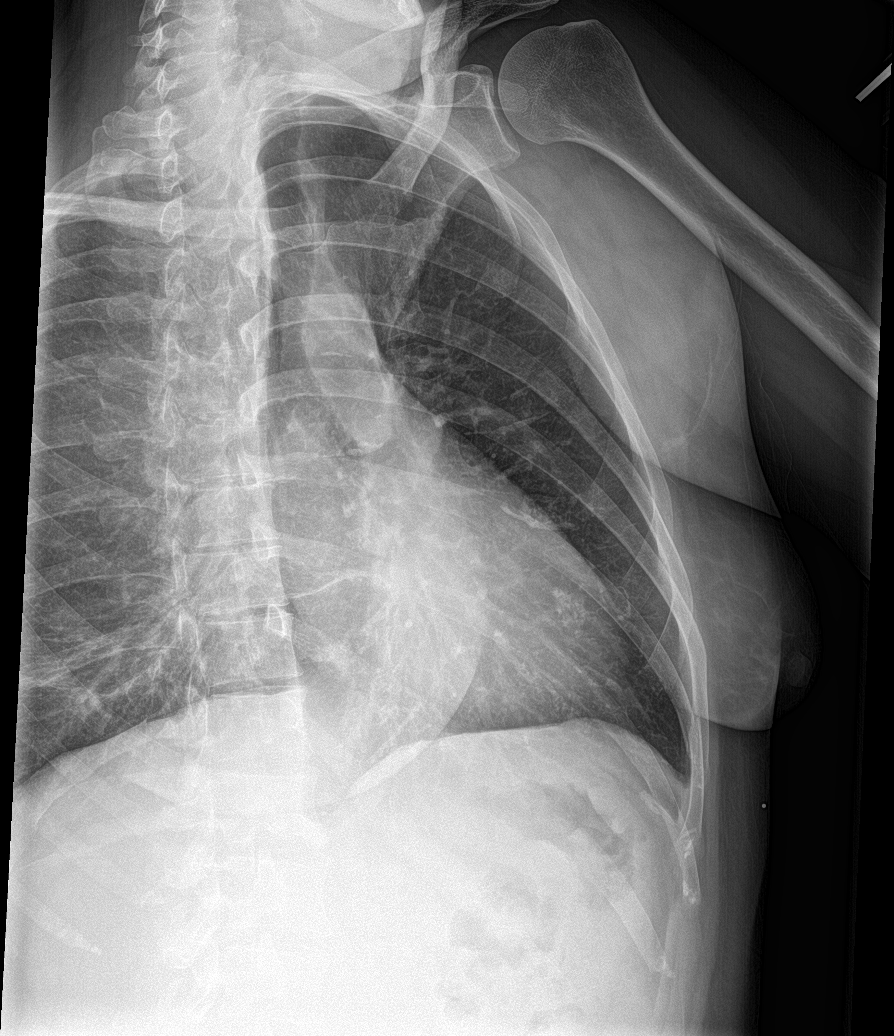

[rib ap obl]
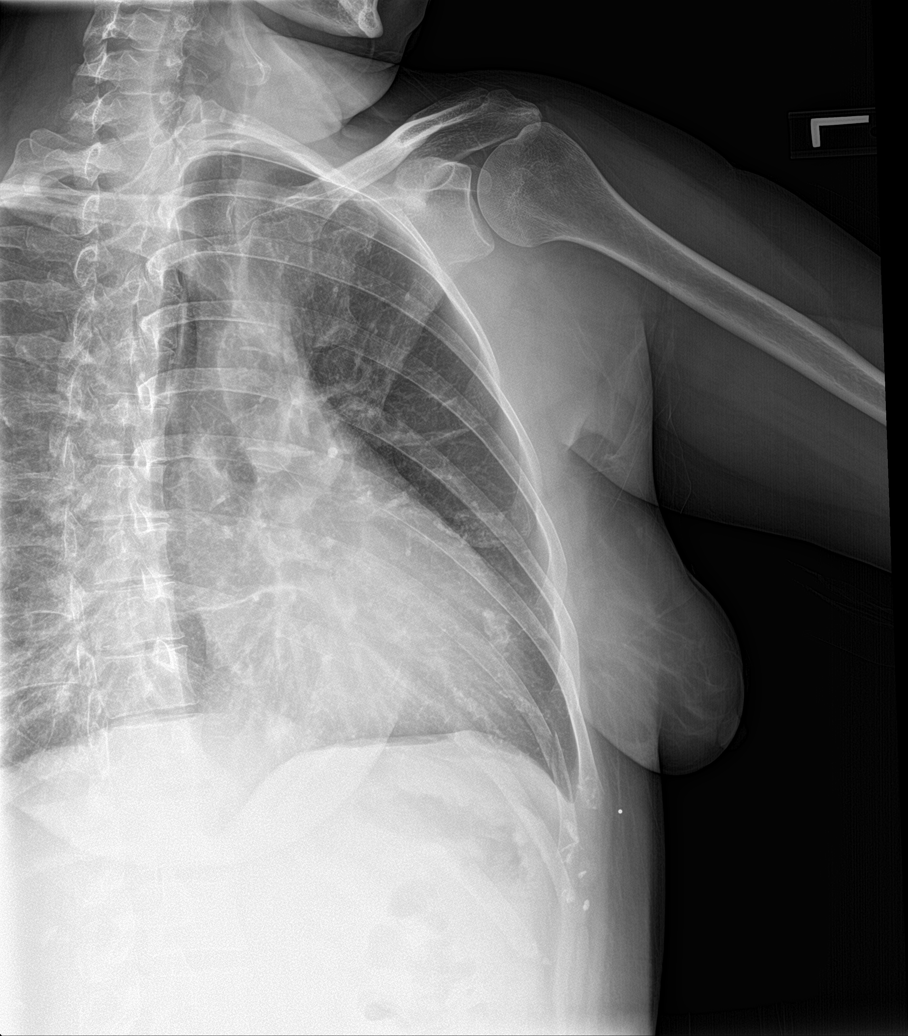

[5 of 5 positions shown; findings below may reference images not displayed]

FINDINGS: Five views of the left ribs submitted. There is minimal displaced
fracture or of the left eighth rib. No pneumothorax. No infiltrate
or pulmonary edema.
IMPRESSION: Minimal displaced fracture of the left eighth rib.  No pneumothorax.

## 2018-12-03 ENCOUNTER — Ambulatory Visit (INDEPENDENT_AMBULATORY_CARE_PROVIDER_SITE_OTHER): Payer: 59 | Admitting: Professional

## 2018-12-03 DIAGNOSIS — F430 Acute stress reaction: Secondary | ICD-10-CM

## 2019-01-07 ENCOUNTER — Ambulatory Visit: Payer: 59 | Admitting: Professional

## 2019-03-04 ENCOUNTER — Emergency Department
Admission: EM | Admit: 2019-03-04 | Discharge: 2019-03-04 | Disposition: A | Discharge status: Home / Self Care | Payer: 59 | Source: Home / Self Care

## 2019-03-04 ENCOUNTER — Other Ambulatory Visit: Payer: Self-pay

## 2019-03-04 DIAGNOSIS — K047 Periapical abscess without sinus: Secondary | ICD-10-CM

## 2019-03-04 MED ORDER — CLINDAMYCIN HCL 150 MG PO CAPS: 300.0000 mg | ORAL_CAPSULE | Freq: Three times a day (TID) | ORAL | 0 refills | Status: AC

## 2019-03-04 NOTE — Discharge Instructions (Signed)
°  Please take antibiotics as prescribed and be sure to complete entire course even if you start to feel better to ensure infection does not come back.  Please follow up with family medicine later this week for recheck of symptoms if not improving, otherwise, follow up as scheduled with your dentist next week for definitive care.

## 2019-03-04 NOTE — ED Triage Notes (Signed)
Abscess tooth lower front.  Has appointment next Monday

## 2019-03-05 NOTE — ED Provider Notes (Signed)
[facility]    CSN: [id number] Arrival date & time: 03/04/19  1602      History   Chief Complaint Chief Complaint  Patient presents with  . Dental Pain    HPI Jennifer Robinson is a 43 y.o. female.   HPI  Jennifer Robinson is a 43 y.o. female presenting to UC with c/o gradually worsening lower front tooth and gum pain with swelling for the last 3-4 days. Symptoms feel similar to prior dental abscesses.  She [place] teeth [place]. She is requesting and antibiotic today. Denies fever, chills, n/v/d. No drainage or bleeding from her gums.   Past Medical History:  Diagnosis Date  . Alopecia   . Arthritis    both knees  . Carpal tunnel syndrome of right wrist 03/2014  . Dental crown present   . Seasonal allergies   . Wears partial dentures    upper    Patient Active Problem List   Diagnosis Date Noted  . Depression, major, single episode, moderate (HCC) 09/08/2018  . Acute stress reaction 09/08/2018  . Grief at loss of child 08/02/2018  . Dyslipidemia (high LDL; low HDL) 10/27/2016  . Smoker 09/02/2016  . Fracture of rib, closed 08/30/2016  . Hidradenitis suppurativa of anus 07/05/2013  . Bilateral carpal tunnel syndrome 03/29/2013  . Right patellofemoral syndrome 03/29/2013    Past Surgical History:  Procedure Laterality Date  . CARPAL TUNNEL RELEASE Left 03/10/2014   Procedure: CARPAL TUNNEL RELEASE ENDOSCOPIC LEFT ;  Surgeon: Jodi Marbleavid A Thompson, MD;  Location: [facility];  Service: Orthopedics;  Laterality: Left;  . CARPAL TUNNEL RELEASE Right 03/31/2014   Procedure: RIGHT CARPAL TUNNEL RELEASE ENDOSCOPIC;  Surgeon: Jodi Marbleavid A Thompson, MD;  Location: [facility];  Service: Orthopedics;  Laterality: Right;    OB History   No obstetric history on file.      Home Medications    Prior to Admission medications   Medication Sig Start Date End Date Taking? Authorizing Provider   clindamycin (CLEOCIN) 150 MG capsule Take 2 capsules (300 mg total) by mouth 3 (three) times daily for 7 days. 03/04/19 03/11/19  Lurene ShadowPhelps, Lagena Strand O, PA-C  clonazePAM (KLONOPIN) 1 MG tablet Take 0.5-1 tablets (0.5-1 mg total) by mouth 2 (two) times daily. 09/07/18   Breeback, Jade L, PA-C  eszopiclone (LUNESTA) 2 MG TABS tablet Take 1 tablet (2 mg total) by mouth at bedtime as needed for sleep. Take immediately before bedtime 09/07/18   Breeback, Jade L, PA-C  HUMIRA 40 MG/0.4ML PSKT once a week.  07/21/18   [provider]  lidocaine-prilocaine (EMLA) cream Apply 1 application topically as needed.    [provider]  spironolactone (ALDACTONE) 25 MG tablet Take 25 mg by mouth daily.  07/12/18   [provider]    Family History Family History  Problem Relation Age of Onset  . Cancer Mother        ovarian, colon CA  . Hyperlipidemia Father   . Heart failure Father     Social History Social History   Tobacco Use  . Smoking status: Current Every Day Smoker    Packs/day: 1.00    Years: 22.00    Pack years: 22.00    Types: Cigarettes  . Smokeless tobacco: Never Used  Substance Use Topics  . Alcohol use: Yes    Comment: occasionally  . Drug use: No     Allergies   Minocycline,  Latex, Penicillins, and Ppd [tuberculin purified protein derivative]   Review of Systems Review of Systems  Constitutional: Negative for chills and fever.  HENT: Positive for dental problem. Negative for facial swelling and sore throat.   Gastrointestinal: Negative for nausea and vomiting.     Physical Exam Triage Vital Signs ED Triage Vitals  Enc Vitals Group     BP 03/04/19 1642 (!) 152/94     Pulse Rate 03/04/19 1642 100     Resp 03/04/19 1642 20     Temp 03/04/19 1642 98.8 F (37.1 C)     Temp Source 03/04/19 1642 Oral     SpO2 03/04/19 1642 98 %     Weight 03/04/19 1639 178 lb (80.7 kg)     Height 03/04/19 1639 5\' 7"  (1.702 m)     Head Circumference --      Peak  Flow --      Pain Score 03/04/19 1639 4     Pain Loc --      Pain Edu? --      Excl. in GC? --    No data found.  Updated Vital Signs BP (!) 152/94   Pulse 100   Temp 98.8 F (37.1 C) (Oral)   Resp 20   Ht 5\' 7"  (1.702 m)   Wt 178 lb (80.7 kg)   LMP 02/11/2019   SpO2 98%   BMI 27.88 kg/m   Visual Acuity Right Eye Distance:   Left Eye Distance:   Bilateral Distance:    Right Eye Near:   Left Eye Near:    Bilateral Near:     Physical Exam Vitals signs and nursing note reviewed.  Constitutional:      Appearance: Normal appearance. She is well-developed.  HENT:     Head: Normocephalic and atraumatic.     Mouth/Throat:     Lips: Pink.     Mouth: Mucous membranes are moist.     Dentition: Dental tenderness, gingival swelling, dental caries and dental abscesses present.   Neck:     Musculoskeletal: Normal range of motion.  Cardiovascular:     Rate and Rhythm: Normal rate.  Pulmonary:     Effort: Pulmonary effort is normal.  Musculoskeletal: Normal range of motion.  Skin:    General: Skin is warm and dry.  Neurological:     Mental Status: She is alert and oriented to person, place, and time.  Psychiatric:        Behavior: Behavior normal.      UC Treatments / Results  Labs (all labs ordered are listed, but only abnormal results are displayed) Labs Reviewed - No data to display  EKG   Radiology No results found.  Procedures Procedures (including critical care time)  Medications Ordered in UC Medications - No data to display  Initial Impression / Assessment and Plan / UC Course  I have reviewed the triage vital signs and the nursing notes.  Pertinent labs & imaging results that were available during my care of the patient were reviewed by me and considered in my medical decision making (see chart for details).     Hx and exam c/w dental abscess  will start pt on clindamycin AVS provided  Final Clinical Impressions(s) / UC Diagnoses   Final  diagnoses:  Dental abscess     Discharge Instructions      Please take antibiotics as prescribed and be sure to complete entire course even if you start to feel better to ensure infection does  not come back.  Please follow up with family medicine later this week for recheck of symptoms if not improving, otherwise, follow up as scheduled with your dentist next week for definitive care.     ED Prescriptions    Medication Sig Dispense Auth. Provider   clindamycin (CLEOCIN) 150 MG capsule Take 2 capsules (300 mg total) by mouth 3 (three) times daily for 7 days. 42 capsule Noe Gens, [place]     I have reviewed the PDMP during this encounter.   Noe Gens, PA-C 03/05/19 1052

## 2019-08-02 ENCOUNTER — Ambulatory Visit: Payer: 59 | Attending: Internal Medicine

## 2019-08-02 DIAGNOSIS — Z23 Encounter for immunization: Secondary | ICD-10-CM

## 2019-08-02 NOTE — Progress Notes (Signed)
   Covid-19 Vaccination Clinic  Name:  Cheryln Balcom    MRN: [id number] DOB: 05/23/1976  08/02/2019  Ms. Koons was observed post Covid-19 immunization for 15 minutes without incident. She was provided with Vaccine Information Sheet and instruction to access the V-Safe system.   Ms. Bibian was instructed to call 911 with any severe reactions post vaccine: Marland Kitchen Difficulty breathing  . Swelling of face and throat  . A fast heartbeat  . A bad rash all over body  . Dizziness and weakness   Immunizations Administered    Name Date Dose VIS Date Route   Pfizer COVID-19 Vaccine 08/02/2019  2:09 PM 0.3 mL 05/03/2019 Intramuscular   Manufacturer: [employer], [employer]   Lot: [id number]   NDC: [address]-5

## 2019-08-27 ENCOUNTER — Ambulatory Visit: Payer: 59 | Attending: Internal Medicine

## 2019-08-27 DIAGNOSIS — Z23 Encounter for immunization: Secondary | ICD-10-CM

## 2019-08-27 NOTE — Progress Notes (Signed)
   Covid-19 Vaccination Clinic  Name:  Jennifer Robinson    MRN: [id number] DOB: May 31, 1975  08/27/2019  [name] was observed post Covid-19 immunization for 15 minutes without incident. She was provided with Vaccine Information Sheet and instruction to access the V-Safe system.   [name] was instructed to call 911 with any severe reactions post vaccine: Marland Kitchen Difficulty breathing  . Swelling of face and throat  . A fast heartbeat  . A bad rash all over body  . Dizziness and weakness   Immunizations Administered    Name Date Dose VIS Date Route   Pfizer COVID-19 Vaccine 08/27/2019 11:33 AM 0.3 mL 05/03/2019 Intramuscular   Manufacturer: [employer], [employer]   Lot: [id number]   NDC: [address]-5

## 2020-07-24 ENCOUNTER — Emergency Department: Admit: 2020-07-24 | Discharge status: Absent without leave | Payer: Self-pay

## 2020-07-24 ENCOUNTER — Other Ambulatory Visit: Payer: Self-pay

## 2020-07-24 ENCOUNTER — Emergency Department
Admission: EM | Admit: 2020-07-24 | Discharge: 2020-07-24 | Disposition: A | Discharge status: Home / Self Care | Payer: BC Managed Care – PPO | Source: Home / Self Care | Attending: Family Medicine | Admitting: Family Medicine

## 2020-07-24 ENCOUNTER — Encounter: Payer: Self-pay | Admitting: Emergency Medicine

## 2020-07-24 DIAGNOSIS — S0501XA Injury of conjunctiva and corneal abrasion without foreign body, right eye, initial encounter: Secondary | ICD-10-CM

## 2020-07-24 MED ORDER — SULFACETAMIDE SODIUM 10 % OP SOLN: 1.0000 [drp] | OPHTHALMIC | 0 refills | Status: AC

## 2020-07-24 NOTE — ED Provider Notes (Signed)
[facility]    CSN: [id number] Arrival date & time: 07/24/20  1515      History   Chief Complaint Chief Complaint  Patient presents with  . Eye Problem    HPI Jennifer Robinson is a 45 y.o. female.   Last night patient developed right eye pain, redness, and increased lacrimation.  She removed her right contact lens, but the symptoms persisted this morning.  She denies eye injury or foreign body.  The history is provided by the patient.  Eye Problem Location:  Right eye Quality:  Burning and foreign body sensation Severity:  Mild Onset quality:  Sudden Duration:  1 day Timing:  Constant Progression:  Worsening Chronicity:  New Context: contact lenses   Context: not burn, not direct trauma, not foreign body and not scratch   Relieved by:  Nothing Worsened by:  Eye movement Ineffective treatments:  Eye drops Associated symptoms: redness and tearing   Associated symptoms: no blurred vision, no crusting, no decreased vision, no discharge, no double vision, no facial rash, no headaches, no itching, no photophobia and no scotomas   Risk factors: no conjunctival hemorrhage, no exposure to pinkeye, no previous injury to eye, no recent herpes zoster and no recent URI     Past Medical History:  Diagnosis Date  . Alopecia   . Arthritis    both knees  . Carpal tunnel syndrome of right wrist 03/2014  . Dental crown present   . Seasonal allergies   . Wears partial dentures    upper    Patient Active Problem List   Diagnosis Date Noted  . Depression, major, single episode, moderate (HCC) 09/08/2018  . Acute stress reaction 09/08/2018  . Grief at loss of child 08/02/2018  . Dyslipidemia (high LDL; low HDL) 10/27/2016  . Smoker 09/02/2016  . Fracture of rib, closed 08/30/2016  . Hidradenitis suppurativa of anus 07/05/2013  . Bilateral carpal tunnel syndrome 03/29/2013  . Right patellofemoral syndrome 03/29/2013    Past Surgical History:  Procedure Laterality  Date  . CARPAL TUNNEL RELEASE Left 03/10/2014   Procedure: CARPAL TUNNEL RELEASE ENDOSCOPIC LEFT ;  Surgeon: Jodi Marble, MD;  Location: [facility];  Service: Orthopedics;  Laterality: Left;  . CARPAL TUNNEL RELEASE Right 03/31/2014   Procedure: RIGHT CARPAL TUNNEL RELEASE ENDOSCOPIC;  Surgeon: Jodi Marble, MD;  Location: [facility];  Service: Orthopedics;  Laterality: Right;    OB History   No obstetric history on file.      Home Medications    Prior to Admission medications   Medication Sig Start Date End Date Taking? Authorizing Provider  sulfacetamide (BLEPH-10) 10 % ophthalmic solution Place 1-2 drops into the right eye every 3 (three) hours while awake. 07/24/20  Yes Lattie Haw, MD    Family History Family History  Problem Relation Age of Onset  . Cancer Mother        ovarian, colon CA  . Hyperlipidemia Father   . Heart failure Father     Social History Social History   Tobacco Use  . Smoking status: Current Every Day Smoker    Packs/day: 1.00    Years: 22.00    Pack years: 22.00    Types: Cigarettes  . Smokeless tobacco: Never Used  Vaping Use  . Vaping Use: Never used  Substance Use Topics  . Alcohol use: Yes    Comment: occasionally  . Drug use: No     Allergies   Minocycline,  Latex, Penicillins, and Ppd [tuberculin purified protein derivative]   Review of Systems Review of Systems  Eyes: Positive for pain and redness. Negative for blurred vision, double vision, photophobia, discharge, itching and visual disturbance.  Neurological: Negative for headaches.  All other systems reviewed and are negative.    Physical Exam Triage Vital Signs ED Triage Vitals  Enc Vitals Group     BP 07/24/20 1558 (!) 140/103     Pulse Rate 07/24/20 1558 98     Resp 07/24/20 1558 18     Temp 07/24/20 1558 98.6 F (37 C)     Temp Source 07/24/20 1558 Oral     SpO2 07/24/20 1558 96 %     Weight 07/24/20 1559 170 lb (77.1  kg)     Height 07/24/20 1559 5\' 7"  (1.702 m)     Head Circumference --      Peak Flow --      Pain Score 07/24/20 1559 4     Pain Loc --      Pain Edu? --      Excl. in GC? --    No data found.  Updated Vital Signs BP (!) 140/103 (BP Location: Right Arm)   Pulse 98   Temp 98.6 F (37 C) (Oral)   Resp 18   Ht 5\' 7"  (1.702 m)   Wt 77.1 kg   LMP 07/16/2020 (Approximate)   SpO2 96%   BMI 26.63 kg/m   Visual Acuity Right Eye Distance: 20/200 Left Eye Distance: 20/40 Bilateral Distance: 20/40  Right Eye Near:   Left Eye Near:    Bilateral Near:     Physical Exam Vitals and nursing note reviewed.  Constitutional:      General: She is not in acute distress. HENT:     Head: Normocephalic.     Right Ear: External ear normal.     Left Ear: External ear normal.     Nose: Nose normal.     Mouth/Throat:     Pharynx: Oropharynx is clear.  Eyes:     General: Lids are normal. Lids are everted, no foreign bodies appreciated. Vision grossly intact. Gaze aligned appropriately.        Right eye: No foreign body, discharge or hordeolum.     Extraocular Movements: Extraocular movements intact.     Pupils: Pupils are equal, round, and reactive to light.     Right eye: Corneal abrasion and fluorescein uptake present.      Comments: Right medial conjunctivae injected. Fluorescein to right eye reveals a 25mm diameter superficial abrasion on the central cornea as noted on diagram.   Cardiovascular:     Rate and Rhythm: Normal rate.  Pulmonary:     Effort: Pulmonary effort is normal.  Skin:    General: Skin is warm and dry.  Neurological:     Mental Status: She is alert.      UC Treatments / Results  Labs (all labs ordered are listed, but only abnormal results are displayed) Labs Reviewed - No data to display  EKG   Radiology No results found.  Procedures Procedures (including critical care time)  Medications Ordered in UC Medications - No data to display  Initial  Impression / Assessment and Plan / UC Course  I have reviewed the triage vital signs and the nursing notes.  Pertinent labs & imaging results that were available during my care of the patient were reviewed by me and considered in my medical decision making (see chart for  details).    Begin sulfacetamide ophthalmic suspension. Followup with ophthalmologist if not improved 4 days.   Final Clinical Impressions(s) / UC Diagnoses   Final diagnoses:  Right corneal abrasion, initial encounter     Discharge Instructions     Avoid wearing contact lens until well.  Wear sunglasses outside.  If symptoms become significantly worse during the night or over the weekend, proceed to the local emergency room.     ED Prescriptions    Medication Sig Dispense Auth. Provider   sulfacetamide (BLEPH-10) 10 % ophthalmic solution Place 1-2 drops into the right eye every 3 (three) hours while awake. 5 mL Lattie Haw, MD        Lattie Haw, MD 07/25/20 [phone]

## 2020-07-24 NOTE — ED Triage Notes (Signed)
RT eye pain, watery, red painful started last night

## 2020-07-24 NOTE — Discharge Instructions (Addendum)
Avoid wearing contact lens until well.  Wear sunglasses outside.  If symptoms become significantly worse during the night or over the weekend, proceed to the local emergency room.
